# Patient Record
Sex: Female | Born: 1950
Health system: Southern US, Community
[De-identification: ages and names within clinical notes are randomized; demographics above are authoritative.]

## PROBLEM LIST (undated history)

## (undated) DIAGNOSIS — K8020 Calculus of gallbladder without cholecystitis without obstruction: Secondary | ICD-10-CM

## (undated) DIAGNOSIS — K5792 Diverticulitis of intestine, part unspecified, without perforation or abscess without bleeding: Secondary | ICD-10-CM

## (undated) DIAGNOSIS — E7800 Pure hypercholesterolemia, unspecified: Secondary | ICD-10-CM

## (undated) DIAGNOSIS — I10 Essential (primary) hypertension: Secondary | ICD-10-CM

## (undated) HISTORY — PX: TUBAL LIGATION: SHX77

## (undated) HISTORY — DX: Pure hypercholesterolemia, unspecified: E78.00

## (undated) HISTORY — PX: COLONOSCOPY: SHX174

## (undated) HISTORY — PX: BUNIONECTOMY: SHX129

---

## 2001-03-18 ENCOUNTER — Emergency Department (HOSPITAL_COMMUNITY): Admission: EM | Admit: 2001-03-18 | Discharge: 2001-03-18 | Payer: Self-pay | Admitting: Emergency Medicine

## 2006-01-25 ENCOUNTER — Ambulatory Visit (HOSPITAL_COMMUNITY): Admission: RE | Admit: 2006-01-25 | Discharge: 2006-01-25 | Payer: Self-pay | Admitting: Internal Medicine

## 2006-07-19 ENCOUNTER — Ambulatory Visit: Payer: Self-pay | Admitting: Internal Medicine

## 2008-05-22 ENCOUNTER — Ambulatory Visit (HOSPITAL_COMMUNITY): Admission: RE | Admit: 2008-05-22 | Discharge: 2008-05-22 | Payer: Self-pay | Admitting: Family Medicine

## 2010-04-16 ENCOUNTER — Encounter (HOSPITAL_COMMUNITY): Admission: RE | Admit: 2010-04-16 | Payer: Self-pay | Source: Home / Self Care | Admitting: Preventative Medicine

## 2010-05-09 ENCOUNTER — Encounter: Payer: Self-pay | Admitting: Internal Medicine

## 2012-02-06 ENCOUNTER — Emergency Department (HOSPITAL_COMMUNITY): Payer: BC Managed Care – PPO

## 2012-02-06 ENCOUNTER — Emergency Department (HOSPITAL_COMMUNITY)
Admission: EM | Admit: 2012-02-06 | Discharge: 2012-02-07 | Disposition: A | Discharge status: Home / Self Care | Payer: BC Managed Care – PPO | Attending: Emergency Medicine | Admitting: Emergency Medicine

## 2012-02-06 ENCOUNTER — Encounter (HOSPITAL_COMMUNITY): Payer: Self-pay | Admitting: Emergency Medicine

## 2012-02-06 DIAGNOSIS — I10 Essential (primary) hypertension: Secondary | ICD-10-CM | POA: Insufficient documentation

## 2012-02-06 DIAGNOSIS — K5732 Diverticulitis of large intestine without perforation or abscess without bleeding: Secondary | ICD-10-CM | POA: Insufficient documentation

## 2012-02-06 DIAGNOSIS — Z79899 Other long term (current) drug therapy: Secondary | ICD-10-CM | POA: Insufficient documentation

## 2012-02-06 HISTORY — DX: Essential (primary) hypertension: I10

## 2012-02-06 LAB — BASIC METABOLIC PANEL
CO2: 25 mEq/L (ref 19–32)
Calcium: 9.6 mg/dL (ref 8.4–10.5)
Creatinine, Ser: 0.8 mg/dL (ref 0.50–1.10)
Glucose, Bld: 102 mg/dL — ABNORMAL HIGH (ref 70–99)

## 2012-02-06 LAB — CBC WITH DIFFERENTIAL/PLATELET
Eosinophils Absolute: 0.3 10*3/uL (ref 0.0–0.7)
Eosinophils Relative: 4 % (ref 0–5)
HCT: 37.2 % (ref 36.0–46.0)
Lymphocytes Relative: 48 % — ABNORMAL HIGH (ref 12–46)
Lymphs Abs: 3.5 10*3/uL (ref 0.7–4.0)
MCH: 23 pg — ABNORMAL LOW (ref 26.0–34.0)
MCV: 71.4 fL — ABNORMAL LOW (ref 78.0–100.0)
Monocytes Absolute: 0.5 10*3/uL (ref 0.1–1.0)
RBC: 5.21 MIL/uL — ABNORMAL HIGH (ref 3.87–5.11)
RDW: 13.4 % (ref 11.5–15.5)
WBC: 7.2 10*3/uL (ref 4.0–10.5)

## 2012-02-06 LAB — URINALYSIS, ROUTINE W REFLEX MICROSCOPIC
Bilirubin Urine: NEGATIVE
Glucose, UA: NEGATIVE mg/dL
Ketones, ur: NEGATIVE mg/dL
Protein, ur: NEGATIVE mg/dL

## 2012-02-06 LAB — URINE MICROSCOPIC-ADD ON

## 2012-02-06 NOTE — ED Notes (Signed)
Patient c/o left flank that radiates into the front of abdomen with associated nausea.  Patient states has had for 2 weeks; seen by 2 MD that state she has kidney stone; has not had CT scan performed.

## 2012-02-07 ENCOUNTER — Encounter (HOSPITAL_COMMUNITY): Payer: Self-pay | Admitting: *Deleted

## 2012-02-07 ENCOUNTER — Inpatient Hospital Stay (HOSPITAL_COMMUNITY)
Admission: EM | Admit: 2012-02-07 | Discharge: 2012-02-09 | DRG: 182 | Disposition: A | Discharge status: Home / Self Care | Payer: BC Managed Care – PPO | Attending: Internal Medicine | Admitting: Internal Medicine

## 2012-02-07 DIAGNOSIS — I10 Essential (primary) hypertension: Secondary | ICD-10-CM | POA: Diagnosis present

## 2012-02-07 DIAGNOSIS — K8020 Calculus of gallbladder without cholecystitis without obstruction: Secondary | ICD-10-CM | POA: Diagnosis present

## 2012-02-07 DIAGNOSIS — R112 Nausea with vomiting, unspecified: Secondary | ICD-10-CM | POA: Diagnosis present

## 2012-02-07 DIAGNOSIS — K5732 Diverticulitis of large intestine without perforation or abscess without bleeding: Principal | ICD-10-CM

## 2012-02-07 DIAGNOSIS — E876 Hypokalemia: Secondary | ICD-10-CM | POA: Diagnosis present

## 2012-02-07 DIAGNOSIS — K5792 Diverticulitis of intestine, part unspecified, without perforation or abscess without bleeding: Secondary | ICD-10-CM | POA: Diagnosis present

## 2012-02-07 DIAGNOSIS — Z87891 Personal history of nicotine dependence: Secondary | ICD-10-CM

## 2012-02-07 DIAGNOSIS — E860 Dehydration: Secondary | ICD-10-CM | POA: Diagnosis present

## 2012-02-07 DIAGNOSIS — Z880 Allergy status to penicillin: Secondary | ICD-10-CM

## 2012-02-07 DIAGNOSIS — D649 Anemia, unspecified: Secondary | ICD-10-CM | POA: Diagnosis present

## 2012-02-07 DIAGNOSIS — Z79899 Other long term (current) drug therapy: Secondary | ICD-10-CM

## 2012-02-07 HISTORY — DX: Calculus of gallbladder without cholecystitis without obstruction: K80.20

## 2012-02-07 HISTORY — DX: Diverticulitis of intestine, part unspecified, without perforation or abscess without bleeding: K57.92

## 2012-02-07 LAB — CBC
MCV: 71 fL — ABNORMAL LOW (ref 78.0–100.0)
Platelets: 282 10*3/uL (ref 150–400)
RDW: 13.4 % (ref 11.5–15.5)
WBC: 5 10*3/uL (ref 4.0–10.5)

## 2012-02-07 LAB — CREATININE, SERUM
Creatinine, Ser: 0.73 mg/dL (ref 0.50–1.10)
GFR calc Af Amer: >90 mL/min (ref >90)
GFR calc non Af Amer: 90 mL/min — ABNORMAL LOW (ref >90)

## 2012-02-07 MED ORDER — ONDANSETRON HCL 4 MG/2ML IJ SOLN: 4.0000 mg | Freq: Three times a day (TID) | INTRAMUSCULAR | Status: DC | PRN

## 2012-02-07 MED ORDER — SODIUM CHLORIDE 0.9 % IV SOLN: INTRAVENOUS | Status: DC

## 2012-02-07 MED ORDER — POTASSIUM CHLORIDE IN NACL 20-0.45 MEQ/L-% IV SOLN
INTRAVENOUS | Status: DC
  Administered 2012-02-07 – 2012-02-08 (×2): via INTRAVENOUS
  Filled 2012-02-07 (×4): qty 1000

## 2012-02-07 MED ORDER — CIPROFLOXACIN HCL 250 MG PO TABS
500.0000 mg | ORAL_TABLET | Freq: Once | ORAL | Status: AC
  Administered 2012-02-07: 500 mg via ORAL
  Filled 2012-02-07: qty 2

## 2012-02-07 MED ORDER — HYDROMORPHONE HCL PF 1 MG/ML IJ SOLN
1.0000 mg | INTRAMUSCULAR | Status: DC | PRN
  Administered 2012-02-07 – 2012-02-08 (×2): 1 mg via INTRAVENOUS
  Filled 2012-02-07 (×2): qty 1

## 2012-02-07 MED ORDER — ONDANSETRON HCL 4 MG PO TABS: 4.0000 mg | ORAL_TABLET | Freq: Four times a day (QID) | ORAL | Status: DC | PRN

## 2012-02-07 MED ORDER — CIPROFLOXACIN IN D5W 400 MG/200ML IV SOLN
400.0000 mg | Freq: Two times a day (BID) | INTRAVENOUS | Status: DC
  Administered 2012-02-08 – 2012-02-09 (×3): 400 mg via INTRAVENOUS
  Filled 2012-02-07 (×5): qty 200

## 2012-02-07 MED ORDER — ENOXAPARIN SODIUM 40 MG/0.4ML ~~LOC~~ SOLN
40.0000 mg | SUBCUTANEOUS | Status: DC
  Administered 2012-02-07 – 2012-02-08 (×2): 40 mg via SUBCUTANEOUS
  Filled 2012-02-07 (×2): qty 0.4

## 2012-02-07 MED ORDER — CIPROFLOXACIN IN D5W 400 MG/200ML IV SOLN
400.0000 mg | Freq: Once | INTRAVENOUS | Status: AC
  Administered 2012-02-07: 400 mg via INTRAVENOUS
  Filled 2012-02-07: qty 200

## 2012-02-07 MED ORDER — HYDROCODONE-ACETAMINOPHEN 5-325 MG PO TABS: 1.0000 | ORAL_TABLET | Freq: Four times a day (QID) | ORAL | Status: DC | PRN

## 2012-02-07 MED ORDER — POTASSIUM CHLORIDE IN NACL 20-0.45 MEQ/L-% IV SOLN
INTRAVENOUS | Status: AC
  Filled 2012-02-07: qty 1000

## 2012-02-07 MED ORDER — METRONIDAZOLE 500 MG PO TABS: 500.0000 mg | ORAL_TABLET | Freq: Two times a day (BID) | ORAL | Status: DC

## 2012-02-07 MED ORDER — METRONIDAZOLE IN NACL 5-0.79 MG/ML-% IV SOLN
500.0000 mg | Freq: Once | INTRAVENOUS | Status: AC
  Administered 2012-02-07: 500 mg via INTRAVENOUS
  Filled 2012-02-07: qty 100

## 2012-02-07 MED ORDER — ACETAMINOPHEN 325 MG PO TABS: 650.0000 mg | ORAL_TABLET | Freq: Four times a day (QID) | ORAL | Status: DC | PRN

## 2012-02-07 MED ORDER — HYDROMORPHONE HCL PF 1 MG/ML IJ SOLN
1.0000 mg | INTRAMUSCULAR | Status: DC | PRN
  Administered 2012-02-07: 1 mg via INTRAVENOUS
  Filled 2012-02-07: qty 1

## 2012-02-07 MED ORDER — POTASSIUM CHLORIDE CRYS ER 20 MEQ PO TBCR
40.0000 meq | EXTENDED_RELEASE_TABLET | Freq: Once | ORAL | Status: AC
  Administered 2012-02-07: 40 meq via ORAL
  Filled 2012-02-07: qty 2

## 2012-02-07 MED ORDER — ONDANSETRON HCL 4 MG/2ML IJ SOLN
4.0000 mg | Freq: Four times a day (QID) | INTRAMUSCULAR | Status: DC | PRN
  Administered 2012-02-08 – 2012-02-09 (×3): 4 mg via INTRAVENOUS
  Filled 2012-02-07 (×3): qty 2

## 2012-02-07 MED ORDER — HYDROCODONE-ACETAMINOPHEN 5-325 MG PO TABS
1.0000 | ORAL_TABLET | Freq: Four times a day (QID) | ORAL | Status: DC | PRN
  Administered 2012-02-07 – 2012-02-08 (×2): 1 via ORAL
  Filled 2012-02-07 (×2): qty 1

## 2012-02-07 MED ORDER — METRONIDAZOLE 500 MG PO TABS
500.0000 mg | ORAL_TABLET | Freq: Once | ORAL | Status: AC
  Administered 2012-02-07: 500 mg via ORAL
  Filled 2012-02-07: qty 1

## 2012-02-07 MED ORDER — ACETAMINOPHEN 650 MG RE SUPP: 650.0000 mg | Freq: Four times a day (QID) | RECTAL | Status: DC | PRN

## 2012-02-07 MED ORDER — METRONIDAZOLE IN NACL 5-0.79 MG/ML-% IV SOLN
500.0000 mg | Freq: Three times a day (TID) | INTRAVENOUS | Status: DC
  Administered 2012-02-08 – 2012-02-09 (×5): 500 mg via INTRAVENOUS
  Filled 2012-02-07 (×8): qty 100

## 2012-02-07 MED ORDER — SODIUM CHLORIDE 0.9 % IV SOLN
INTRAVENOUS | Status: DC
  Administered 2012-02-07: 125 mL/h via INTRAVENOUS

## 2012-02-07 MED ORDER — LISINOPRIL 5 MG PO TABS
5.0000 mg | ORAL_TABLET | Freq: Every day | ORAL | Status: DC
  Administered 2012-02-07 – 2012-02-09 (×3): 5 mg via ORAL
  Filled 2012-02-07 (×3): qty 1

## 2012-02-07 MED ORDER — CIPROFLOXACIN HCL 500 MG PO TABS: 500.0000 mg | ORAL_TABLET | Freq: Two times a day (BID) | ORAL | Status: DC

## 2012-02-07 MED ORDER — TRAMADOL HCL 50 MG PO TABS
50.0000 mg | ORAL_TABLET | Freq: Four times a day (QID) | ORAL | Status: DC
  Administered 2012-02-07 – 2012-02-09 (×6): 50 mg via ORAL
  Filled 2012-02-07 (×6): qty 1

## 2012-02-07 MED ORDER — CYCLOBENZAPRINE HCL 10 MG PO TABS: 10.0000 mg | ORAL_TABLET | Freq: Three times a day (TID) | ORAL | Status: DC | PRN

## 2012-02-07 NOTE — ED Provider Notes (Addendum)
History     CSN: [id number]  Arrival date & time 02/06/12  2156   First MD Initiated Contact with Patient 02/06/12 2306      Chief Complaint  Patient presents with  . Flank Pain    (Consider location/radiation/quality/duration/timing/severity/associated sxs/prior treatment) HPI Comments: States she has been having L flank pain ~ 2 weeks.  Has been seen by 2 MD's that have told her she has a kidney stone.  Has not had any imaging performed.  No fever or chills.  The history is provided by the patient. No language interpreter was used.    Past Medical History  Diagnosis Date  . Hypertension     Past Surgical History  Procedure Date  . Tubal ligation     No family history on file.  History  Substance Use Topics  . Smoking status: Former Games developer  . Smokeless tobacco: Not on file  . Alcohol Use: Yes    OB History    Grav Para Term Preterm Abortions TAB SAB Ect Mult Living                  Review of Systems  Constitutional: Negative for fever and chills.  Gastrointestinal: Positive for abdominal pain. Negative for nausea, vomiting, diarrhea, constipation, blood in stool, abdominal distention and anal bleeding.  Genitourinary: Negative for dysuria, urgency, frequency, hematuria, vaginal bleeding, vaginal discharge, difficulty urinating and vaginal pain.  All other systems reviewed and are negative.    Allergies  Penicillins  Home Medications   Current Outpatient Rx  Name Route Sig Dispense Refill  . CYCLOBENZAPRINE HCL 10 MG PO TABS Oral Take 10 mg by mouth 3 (three) times daily as needed. Muscle spasm    . LISINOPRIL 5 MG PO TABS Oral Take 5 mg by mouth daily.    Marland Kitchen NITROFURANTOIN MACROCRYSTAL 100 MG PO CAPS Oral Take 100 mg by mouth 2 (two) times daily. For 7 days    . TRAMADOL HCL 50 MG PO TABS Oral Take 50 mg by mouth 4 (four) times daily.    Marland Kitchen CIPROFLOXACIN HCL 500 MG PO TABS Oral Take 1 tablet (500 mg total) by mouth 2 (two) times daily. 14 tablet 0  .  METRONIDAZOLE 500 MG PO TABS Oral Take 1 tablet (500 mg total) by mouth 2 (two) times daily. 14 tablet 0    BP 153/94  Pulse 104  Temp 98.1 F (36.7 C) (Oral)  Resp 20  Ht 5\' 1"  (1.549 m)  Wt 170 lb (77.111 kg)  BMI 32.12 kg/m2  SpO2 100%  Physical Exam  Nursing note and vitals reviewed. Constitutional: She is oriented to person, place, and time. She appears well-developed and well-nourished. No distress.  HENT:  Head: Normocephalic and atraumatic.  Eyes: EOM are normal.  Neck: Normal range of motion.  Cardiovascular: Normal rate and regular rhythm.   Pulmonary/Chest: Effort normal.  Abdominal: Soft. She exhibits no distension and no mass. There is no hepatosplenomegaly. There is tenderness in the left upper quadrant and left lower quadrant. There is no rebound, no guarding and no CVA tenderness.    Musculoskeletal: Normal range of motion.  Neurological: She is alert and oriented to person, place, and time.  Skin: Skin is warm and dry.  Psychiatric: She has a normal mood and affect. Judgment normal.    ED Course  Procedures (including critical care time)  Labs Reviewed  CBC WITH DIFFERENTIAL - Abnormal; Notable for the following:    RBC 5.21 (*)  MCV 71.4 (*)     MCH 23.0 (*)     Neutrophils Relative 41 (*)     Lymphocytes Relative 48 (*)     All other components within normal limits  BASIC METABOLIC PANEL - Abnormal; Notable for the following:    Potassium 3.2 (*)     Glucose, Bld 102 (*)     GFR calc non Af Amer 77 (*)     GFR calc Af Amer 90 (*)     All other components within normal limits  URINALYSIS, ROUTINE W REFLEX MICROSCOPIC - Abnormal; Notable for the following:    APPearance HAZY (*)     Specific Gravity, Urine >1.030 (*)     Hgb urine dipstick SMALL (*)     Leukocytes, UA SMALL (*)     All other components within normal limits  URINE MICROSCOPIC-ADD ON - Abnormal; Notable for the following:    Squamous Epithelial / LPF FEW (*)     All other  components within normal limits  URINE CULTURE   Ct Abdomen Pelvis Wo Contrast  02/07/2012  *RADIOLOGY REPORT*  Clinical Data: Left flank pain  CT ABDOMEN AND PELVIS WITHOUT CONTRAST  Technique:  Multidetector CT imaging of the abdomen and pelvis was performed following the standard protocol without intravenous contrast.  Comparison: None.  Findings: Limited images through the lung bases demonstrate no significant appreciable abnormality. The heart size is within normal limits. No pleural or pericardial effusion.  Organ abnormality/lesion detection is limited in the absence of intravenous contrast. Within this limitation, unremarkable liver, spleen, pancreas, adrenal glands.  Gallstones along the gallbladder fundus.  No biliary ductal dilatation.  Symmetric renal size.  No hydronephrosis or hydroureter.  No ureteral calculi.  No bowel obstruction.  Sigmoid colon diverticulosis.  Pericolonic fat stranding about the sigmoid colon/descending colon junction, in keeping with acute diverticulitis.  No free intraperitoneal air or fluid.  No lymphadenopathy.  Decompressed bladder.  Unremarkable CT appearance to the uterus and adnexa.  There is scattered atherosclerotic calcification of the aorta and its branches. No aneurysmal dilatation.  L5 S1 degenerative disc disease.  No acute osseous finding.  IMPRESSION: Acute diverticulitis at the sigmoid colon/descending colon junction.  Age appropriate colonoscopy screening recommended if not yet performed to exclude an underlying lesion.  No hydronephrosis or urinary tract calculi.  Gallstones.  No CT evidence for cholecystitis.   Original Report Authenticated By: Waneta Martins, M.D.      1. Diverticulitis of sigmoid colon       MDM  rx-cipro 500 mg  BID, 14 rx-flagyl 500 mg BID, 14        Evalina Field, [place] 02/07/12 0055  Evalina Field, PA 03/02/12 1701

## 2012-02-07 NOTE — ED Provider Notes (Signed)
History    This chart was scribed for Geoffery Lyons, MD, MD by Smitty Pluck. The patient was seen in room APA18 and the patient's care was started at 3:35PM.   CSN: [id number]  Arrival date & time 02/07/12  1519      Chief Complaint  Patient presents with  . Emesis    (Consider location/radiation/quality/duration/timing/severity/associated sxs/prior treatment) Patient is a 61 y.o. female presenting with vomiting. The history is provided by the patient. No language interpreter was used.  Emesis  Associated symptoms include abdominal pain.   Selena Christian is a 61 y.o. female who presents to the Emergency Department complaining of constant, moderate emesis onset 1 day ago. Pt reports that she was in ED 1 day ago and diagnosed with diverticulitis. She reports that she is unable to take prescribed medication due to nausea. She reports food intake aggravates abdominal pain. She reports that she still has abdominal pain. Denies fever, chills, vomiting blood and any other pain.   PCP Dr. Loreta Ave  Past Medical History  Diagnosis Date  . Hypertension   . Diverticulitis     Past Surgical History  Procedure Date  . Tubal ligation     History reviewed. No pertinent family history.  History  Substance Use Topics  . Smoking status: Former Games developer  . Smokeless tobacco: Not on file  . Alcohol Use: Yes    OB History    Grav Para Term Preterm Abortions TAB SAB Ect Mult Living                  Review of Systems  Gastrointestinal: Positive for nausea, vomiting and abdominal pain.  All other systems reviewed and are negative.    Allergies  Penicillins  Home Medications   Current Outpatient Rx  Name Route Sig Dispense Refill  . CIPROFLOXACIN HCL 500 MG PO TABS Oral Take 1 tablet (500 mg total) by mouth 2 (two) times daily. 14 tablet 0  . CYCLOBENZAPRINE HCL 10 MG PO TABS Oral Take 10 mg by mouth 3 (three) times daily as needed. Muscle spasm    . HYDROCODONE-ACETAMINOPHEN 5-325  MG PO TABS Oral Take 1 tablet by mouth every 6 (six) hours as needed for pain. 20 tablet 0  . LISINOPRIL 5 MG PO TABS Oral Take 5 mg by mouth daily.    Marland Kitchen METRONIDAZOLE 500 MG PO TABS Oral Take 1 tablet (500 mg total) by mouth 2 (two) times daily. 14 tablet 0  . NITROFURANTOIN MACROCRYSTAL 100 MG PO CAPS Oral Take 100 mg by mouth 2 (two) times daily. For 7 days    . TRAMADOL HCL 50 MG PO TABS Oral Take 50 mg by mouth 4 (four) times daily.      BP 137/91  Pulse 95  Temp 98.3 F (36.8 C) (Oral)  Resp 20  Ht 5\' 1"  (1.549 m)  Wt 170 lb (77.111 kg)  BMI 32.12 kg/m2  SpO2 100%  Physical Exam  Nursing note and vitals reviewed. Constitutional: She is oriented to person, place, and time. She appears well-developed and well-nourished. No distress.  HENT:  Head: Normocephalic and atraumatic.  Eyes: EOM are normal. Pupils are equal, round, and reactive to light.  Neck: Normal range of motion. Neck supple. No tracheal deviation present.  Cardiovascular: Normal rate.   Pulmonary/Chest: Effort normal. No respiratory distress.  Abdominal: Soft. She exhibits no distension. There is tenderness in the left upper quadrant and left lower quadrant. There is no rebound and no guarding.  Musculoskeletal:  Normal range of motion.  Neurological: She is alert and oriented to person, place, and time.  Skin: Skin is warm and dry.  Psychiatric: She has a normal mood and affect. Her behavior is normal.    ED Course  Procedures (including critical care time) DIAGNOSTIC STUDIES: Oxygen Saturation is 100% on room air, normal by my interpretation.    COORDINATION OF CARE: 3:42 PM Discussed ED treatment with pt     Labs Reviewed - No data to display Ct Abdomen Pelvis Wo Contrast  02/07/2012  *RADIOLOGY REPORT*  Clinical Data: Left flank pain  CT ABDOMEN AND PELVIS WITHOUT CONTRAST  Technique:  Multidetector CT imaging of the abdomen and pelvis was performed following the standard protocol without intravenous  contrast.  Comparison: None.  Findings: Limited images through the lung bases demonstrate no significant appreciable abnormality. The heart size is within normal limits. No pleural or pericardial effusion.  Organ abnormality/lesion detection is limited in the absence of intravenous contrast. Within this limitation, unremarkable liver, spleen, pancreas, adrenal glands.  Gallstones along the gallbladder fundus.  No biliary ductal dilatation.  Symmetric renal size.  No hydronephrosis or hydroureter.  No ureteral calculi.  No bowel obstruction.  Sigmoid colon diverticulosis.  Pericolonic fat stranding about the sigmoid colon/descending colon junction, in keeping with acute diverticulitis.  No free intraperitoneal air or fluid.  No lymphadenopathy.  Decompressed bladder.  Unremarkable CT appearance to the uterus and adnexa.  There is scattered atherosclerotic calcification of the aorta and its branches. No aneurysmal dilatation.  L5 S1 degenerative disc disease.  No acute osseous finding.  IMPRESSION: Acute diverticulitis at the sigmoid colon/descending colon junction.  Age appropriate colonoscopy screening recommended if not yet performed to exclude an underlying lesion.  No hydronephrosis or urinary tract calculi.  Gallstones.  No CT evidence for cholecystitis.   Original Report Authenticated By: Waneta Martins, M.D.      No diagnosis found.    MDM  The patient with n/v/d since last night.  She was diagnosed with diverticulitis yesterday and discharged with antibiotics she has been unable to keep down.  She will be admitted to medicine for iv antibiotics, hydration.      I personally performed the services described in this documentation, which was scribed in my presence. The recorded information has been reviewed and considered.      Geoffery Lyons, MD 02/07/12 [phone]

## 2012-02-07 NOTE — ED Notes (Signed)
Patient began vomiting after taking dose of Flagyl and Cipro this AM.  Has vomited x 3, yellow fluid.  Epigastric and L lateral abdomen. Stool x 1 today, liquid.  No hematemesis or blood noted in stools.

## 2012-02-07 NOTE — ED Provider Notes (Signed)
Medical screening examination/treatment/procedure(s) were performed by non-physician practitioner and as supervising physician I was immediately available for consultation/collaboration.  Josip Merolla S. Amethyst Gainer, MD 02/07/12 0528 

## 2012-02-07 NOTE — ED Notes (Signed)
Report called to Maryanne on unit 300. 

## 2012-02-07 NOTE — H&P (Signed)
[facility] History and Physical  Selena Christian ZOX:[id number] DOB: April 15, 1951 DOA: 02/07/2012  Referring physician: Dr. Judd Lien PCP: Lenise Herald, PA  Specialists:   Chief Complaint: abd pain  HPI: Selena Christian is a 61 y.o. female that presents to the emergency room with abdominal pain, nausea, vomiting and diarrhea. The patient reports onset of her symptoms approximately 2 weeks ago which started to have left lower quadrant abdominal pain. The symptoms persisted and progressively got worse. She did not have any chills or fever. She presented to the emergency room where CT scan revealed a sigmoid diverticulitis. She was given oral ciprofloxacin and Flagyl and was discharged home. Unfortunately after she reached home, she developed nausea and vomiting and was unable to keep down any of her medications. Her by mouth intake has been poor. She also reports some significant diarrhea, but denies any melena or hematochezia. Since she has failed outpatient therapy for diverticulitis, she's been referred for inpatient therapy  Review of Systems: Pertinent positives per history of present illness, otherwise negative  Past Medical History  Diagnosis Date  . Hypertension   . Diverticulitis    Past Surgical History  Procedure Date  . Tubal ligation    Social History:  reports that she has quit smoking. She does not have any smokeless tobacco history on file. She reports that she drinks alcohol. She reports that she does not use illicit drugs.   Allergies  Allergen Reactions  . Penicillins Other (See Comments)    Child hood allergy    Family history: Mother is on dialysis, father has diabetes, no bowel disease in the family   Prior to Admission medications   Medication Sig Start Date End Date Taking? Authorizing Provider  ciprofloxacin (CIPRO) 500 MG tablet Take 1 tablet (500 mg total) by mouth 2 (two) times daily. 02/07/12  Yes Richard Hyacinth Meeker, PA  cyclobenzaprine (FLEXERIL) 10 MG  tablet Take 10 mg by mouth 3 (three) times daily as needed. Muscle spasm   Yes Historical Provider, MD  HYDROcodone-acetaminophen (NORCO/VICODIN) 5-325 MG per tablet Take 1 tablet by mouth every 6 (six) hours as needed for pain. 02/07/12 02/17/12 Yes Richard Hyacinth Meeker, PA  lisinopril (PRINIVIL,ZESTRIL) 5 MG tablet Take 5 mg by mouth daily.   Yes Historical Provider, MD  metroNIDAZOLE (FLAGYL) 500 MG tablet Take 1 tablet (500 mg total) by mouth 2 (two) times daily. 02/07/12  Yes Evalina Field, PA  traMADol (ULTRAM) 50 MG tablet Take 50 mg by mouth 4 (four) times daily.   Yes Historical Provider, MD   Physical Exam: Filed Vitals:   02/07/12 1522 02/07/12 1808 02/07/12 1823  BP: 137/91 141/81 161/88  Pulse: 95 65 66  Temp: 98.3 F (36.8 C) 98.3 F (36.8 C) 97.9 F (36.6 C)  TempSrc: Oral Oral Oral  Resp: 20  20  Height: 5\' 1"  (1.549 m)  5\' 1"  (1.549 m)  Weight: 77.111 kg (170 lb)  77.1 kg (169 lb 15.6 oz)  SpO2: 100% 98% 100%     General:  NAD  Eyes: Pupils are equal round react to light and accommodation  ENT: Mucous membranes are dry  Neck: Supple  Cardiovascular: S1, S2 regular rate and rhythm  Respiratory: Clear to auscultation bilaterally  Abdomen: Soft, tender in the left lower corner, bowel sounds are active  Skin: Normal  Musculoskeletal: Deferred  Psychiatric: Normal affect, cooperative with exam  Neurologic: Grossly intact, nonfocal  Labs on Admission:  Basic Metabolic Panel:  Lab 02/06/12 8119  NA 137  K 3.2*  CL 101  CO2 25  GLUCOSE 102*  BUN 18  CREATININE 0.80  CALCIUM 9.6  MG --  PHOS --   Liver Function Tests: No results found for this basename: AST:5,ALT:5,ALKPHOS:5,BILITOT:5,PROT:5,ALBUMIN:5 in the last 168 hours No results found for this basename: LIPASE:5,AMYLASE:5 in the last 168 hours No results found for this basename: AMMONIA:5 in the last 168 hours CBC:  Lab 02/06/12 2312  WBC 7.2  NEUTROABS 2.9  HGB 12.0  HCT 37.2  MCV 71.4*    PLT 295   Cardiac Enzymes: No results found for this basename: CKTOTAL:5,CKMB:5,CKMBINDEX:5,TROPONINI:5 in the last 168 hours  BNP (last 3 results) No results found for this basename: PROBNP:3 in the last 8760 hours CBG: No results found for this basename: GLUCAP:5 in the last 168 hours  Radiological Exams on Admission: Ct Abdomen Pelvis Wo Contrast  02/07/2012  *RADIOLOGY REPORT*  Clinical Data: Left flank pain  CT ABDOMEN AND PELVIS WITHOUT CONTRAST  Technique:  Multidetector CT imaging of the abdomen and pelvis was performed following the standard protocol without intravenous contrast.  Comparison: None.  Findings: Limited images through the lung bases demonstrate no significant appreciable abnormality. The heart size is within normal limits. No pleural or pericardial effusion.  Organ abnormality/lesion detection is limited in the absence of intravenous contrast. Within this limitation, unremarkable liver, spleen, pancreas, adrenal glands.  Gallstones along the gallbladder fundus.  No biliary ductal dilatation.  Symmetric renal size.  No hydronephrosis or hydroureter.  No ureteral calculi.  No bowel obstruction.  Sigmoid colon diverticulosis.  Pericolonic fat stranding about the sigmoid colon/descending colon junction, in keeping with acute diverticulitis.  No free intraperitoneal air or fluid.  No lymphadenopathy.  Decompressed bladder.  Unremarkable CT appearance to the uterus and adnexa.  There is scattered atherosclerotic calcification of the aorta and its branches. No aneurysmal dilatation.  L5 S1 degenerative disc disease.  No acute osseous finding.  IMPRESSION: Acute diverticulitis at the sigmoid colon/descending colon junction.  Age appropriate colonoscopy screening recommended if not yet performed to exclude an underlying lesion.  No hydronephrosis or urinary tract calculi.  Gallstones.  No CT evidence for cholecystitis.   Original Report Authenticated By: Waneta Martins, M.D.       Assessment/Plan Principal Problem:  *Diverticulitis Active Problems:  Dehydration  Hypokalemia  Nausea & vomiting  Hypertension   1. Acute diverticulitis. Since patient has failed by mouth therapy, she'll be given IV ciprofloxacin and Flagyl. We will start her on clear liquids and this can be advanced to a low-residue diet as tolerated. She'll receive supportive therapy with analgesics and antiemetics. She will need a screening colonoscopy as an outpatient. She reports her last colonoscopy being approximately 10 years ago. She is already been set up with Dr. fields to be seen as an outpatient. 2. Dehydration. Continue IV fluids 3. Hypokalemia. Replace 4. Hypertension. Continue outpatient regimen  Code Status: Full code  Family Communication: Discussed with patient and multiple family members at the bedside  Disposition Plan: Discharge home once able to tolerate by mouth   Time spent: 55 minutes  Joquan Lotz [facility] Pager [phone]  If 7PM-7AM, please contact night-coverage [email or web address] Password [email or web address] 02/07/2012, 7:09 PM

## 2012-02-07 NOTE — ED Notes (Signed)
Discharge instructions given and reviewed with patient.  Prescriptions given for Hydrocodone, Cipro and Flagyl; effects and use explained.  Patient verbalized understanding to complete antibiotic and sedating effects of Hydrocodone.  Patient ambulatory; discharged home in good condition.

## 2012-02-07 NOTE — ED Notes (Addendum)
Seen here yesterday for diverticulitis,  Now vomiting, thinks due to new meds, cipro and flagyl.  abd pain

## 2012-02-08 LAB — COMPREHENSIVE METABOLIC PANEL
AST: 13 U/L (ref 0–37)
CO2: 24 mEq/L (ref 19–32)
Calcium: 9.1 mg/dL (ref 8.4–10.5)
Creatinine, Ser: 0.81 mg/dL (ref 0.50–1.10)
GFR calc Af Amer: 88 mL/min — ABNORMAL LOW (ref >90)
GFR calc non Af Amer: 76 mL/min — ABNORMAL LOW (ref >90)
Total Protein: 7 g/dL (ref 6.0–8.3)

## 2012-02-08 LAB — CBC
Hemoglobin: 11.1 g/dL — ABNORMAL LOW (ref 12.0–15.0)
MCH: 22.7 pg — ABNORMAL LOW (ref 26.0–34.0)
RBC: 4.9 MIL/uL (ref 3.87–5.11)

## 2012-02-08 MED ORDER — CIPROFLOXACIN IN D5W 400 MG/200ML IV SOLN
INTRAVENOUS | Status: AC
  Filled 2012-02-08: qty 200

## 2012-02-08 MED ORDER — METRONIDAZOLE IN NACL 5-0.79 MG/ML-% IV SOLN
INTRAVENOUS | Status: AC
  Filled 2012-02-08: qty 100

## 2012-02-08 NOTE — Progress Notes (Signed)
[facility]             Progress Note   Subjective: Feeling better today.  No diarrhea.  No vomiting.  She is still nauseous.  Abd pain is improving.  Objective: Vital signs in last 24 hours: Temp:  [97.9 F (36.6 C)-98.3 F (36.8 C)] 98 F (36.7 C) (10/22 1454) Pulse Rate:  [65-68] 66  (10/22 1454) Resp:  [16-20] 16  (10/22 1454) BP: (120-161)/(80-90) 145/90 mmHg (10/22 1454) SpO2:  [96 %-100 %] 96 % (10/22 1454) Weight:  [77.1 kg (169 lb 15.6 oz)] 77.1 kg (169 lb 15.6 oz) (10/21 1823) Weight change:  Last BM Date: 02/07/12  Intake/Output from previous day: 10/21 0701 - 10/22 0700 In: 240 [P.O.:240] Out: -  Total I/O In: 1300 [P.O.:600; I.V.:700] Out: -    Physical Exam: General: Alert, awake, oriented x3, in no acute distress. HEENT: No bruits, no goiter. Heart: Regular rate and rhythm, without murmurs, rubs, gallops. Lungs: Clear to auscultation bilaterally. Abdomen: Soft, nontender, nondistended, positive bowel sounds. Extremities: No clubbing cyanosis or edema with positive pedal pulses. Neuro: Grossly intact, nonfocal.    Lab Results: Basic Metabolic Panel:  Basename 02/08/12 0526 02/07/12 1939 02/06/12 2312  NA 136 -- 137  K 4.1 -- 3.2*  CL 104 -- 101  CO2 24 -- 25  GLUCOSE 133* -- 102*  BUN 11 -- 18  CREATININE 0.81 0.73 --  CALCIUM 9.1 -- 9.6  MG -- -- --  PHOS -- -- --   Liver Function Tests:  Basename 02/08/12 0526  AST 13  ALT 13  ALKPHOS 67  BILITOT 0.3  PROT 7.0  ALBUMIN 3.4*   No results found for this basename: LIPASE:2,AMYLASE:2 in the last 72 hours No results found for this basename: AMMONIA:2 in the last 72 hours CBC:  Basename 02/08/12 0526 02/07/12 1939 02/06/12 2312  WBC 5.2 5.0 --  NEUTROABS -- -- 2.9  HGB 11.1* 11.6* --  HCT 35.2* 35.8* --  MCV 71.8* 71.0* --  PLT 303 282 --   Cardiac Enzymes: No results found for this basename: CKTOTAL:3,CKMB:3,CKMBINDEX:3,TROPONINI:3 in the last 72 hours BNP: No  results found for this basename: PROBNP:3 in the last 72 hours D-Dimer: No results found for this basename: DDIMER:2 in the last 72 hours CBG: No results found for this basename: GLUCAP:6 in the last 72 hours Hemoglobin A1C: No results found for this basename: HGBA1C in the last 72 hours Fasting Lipid Panel: No results found for this basename: CHOL,HDL,LDLCALC,TRIG,CHOLHDL,LDLDIRECT in the last 72 hours Thyroid Function Tests: No results found for this basename: TSH,T4TOTAL,FREET4,T3FREE,THYROIDAB in the last 72 hours Anemia Panel: No results found for this basename: VITAMINB12,FOLATE,FERRITIN,TIBC,IRON,RETICCTPCT in the last 72 hours Coagulation: No results found for this basename: LABPROT:2,INR:2 in the last 72 hours Urine Drug Screen: Drugs of Abuse  No results found for this basename: labopia, cocainscrnur, labbenz, amphetmu, thcu, labbarb    Alcohol Level: No results found for this basename: ETH:2 in the last 72 hours Urinalysis:  Basename 02/06/12 2255  COLORURINE YELLOW  LABSPEC >1.030*  PHURINE 5.5  GLUCOSEU NEGATIVE  HGBUR SMALL*  BILIRUBINUR NEGATIVE  KETONESUR NEGATIVE  PROTEINUR NEGATIVE  UROBILINOGEN 0.2  NITRITE NEGATIVE  LEUKOCYTESUR SMALL*    No results found for this or any previous visit (from the past 240 hour(s)).  Studies/Results: Ct Abdomen Pelvis Wo Contrast  02/07/2012  *RADIOLOGY REPORT*  Clinical Data: Left flank pain  CT ABDOMEN AND PELVIS WITHOUT CONTRAST  Technique:  Multidetector CT imaging of  the abdomen and pelvis was performed following the standard protocol without intravenous contrast.  Comparison: None.  Findings: Limited images through the lung bases demonstrate no significant appreciable abnormality. The heart size is within normal limits. No pleural or pericardial effusion.  Organ abnormality/lesion detection is limited in the absence of intravenous contrast. Within this limitation, unremarkable liver, spleen, pancreas, adrenal glands.   Gallstones along the gallbladder fundus.  No biliary ductal dilatation.  Symmetric renal size.  No hydronephrosis or hydroureter.  No ureteral calculi.  No bowel obstruction.  Sigmoid colon diverticulosis.  Pericolonic fat stranding about the sigmoid colon/descending colon junction, in keeping with acute diverticulitis.  No free intraperitoneal air or fluid.  No lymphadenopathy.  Decompressed bladder.  Unremarkable CT appearance to the uterus and adnexa.  There is scattered atherosclerotic calcification of the aorta and its branches. No aneurysmal dilatation.  L5 S1 degenerative disc disease.  No acute osseous finding.  IMPRESSION: Acute diverticulitis at the sigmoid colon/descending colon junction.  Age appropriate colonoscopy screening recommended if not yet performed to exclude an underlying lesion.  No hydronephrosis or urinary tract calculi.  Gallstones.  No CT evidence for cholecystitis.   Original Report Authenticated By: Waneta Martins, M.D.     Medications: Scheduled Meds:   . ciprofloxacin  400 mg Intravenous Once  . ciprofloxacin  400 mg Intravenous Q12H  . enoxaparin (LOVENOX) injection  40 mg Subcutaneous Q24H  . lisinopril  5 mg Oral Daily  . metronidazole  500 mg Intravenous Once  . metronidazole  500 mg Intravenous Q8H  . potassium chloride  40 mEq Oral Once  . traMADol  50 mg Oral Q6H  . DISCONTD: sodium chloride   Intravenous STAT   Continuous Infusions:   . 0.45 % NaCl with KCl 20 mEq / L 75 mL/hr at 02/08/12 1421  . DISCONTD: sodium chloride 125 mL/hr (02/07/12 1604)   PRN Meds:.acetaminophen, acetaminophen, cyclobenzaprine, HYDROcodone-acetaminophen, HYDROmorphone (DILAUDID) injection, ondansetron (ZOFRAN) IV, ondansetron, DISCONTD:  HYDROmorphone (DILAUDID) injection, DISCONTD: ondansetron (ZOFRAN) IV  Assessment/Plan:  Principal Problem:  *Diverticulitis Active Problems:  Dehydration  Hypokalemia  Nausea & vomiting  Hypertension  1. Diverticulitis.   Clinically improving. Continue IV abx.  Will try and advance diet today.  If patient is able to tolerate dinner without vomiting, I think we can change her to oral abx and plan on discharge home in the morning. If she starts vomiting, then we will have to go back to liquids. Patient has an appointment with Dr. Darrick Penna on Thursday.  I have advised her to keep this appointment. She can be scheduled for outpatient colonoscopy once her acute inflammation has improved.  2. Dehydration, improving  3. Hypokalemia, improved  Time spent coordinating care:   LOS: 1 day   Lorenia Hoston [facility] Pager: [phone] 02/08/2012, 3:44 PM

## 2012-02-09 ENCOUNTER — Encounter (HOSPITAL_COMMUNITY): Payer: Self-pay | Admitting: Internal Medicine

## 2012-02-09 DIAGNOSIS — K8020 Calculus of gallbladder without cholecystitis without obstruction: Secondary | ICD-10-CM

## 2012-02-09 HISTORY — DX: Calculus of gallbladder without cholecystitis without obstruction: K80.20

## 2012-02-09 LAB — URINE CULTURE: Colony Count: 40000

## 2012-02-09 MED ORDER — ONDANSETRON HCL 4 MG PO TABS: 4.0000 mg | ORAL_TABLET | Freq: Three times a day (TID) | ORAL | Status: DC | PRN

## 2012-02-09 MED ORDER — METRONIDAZOLE 500 MG PO TABS: 500.0000 mg | ORAL_TABLET | Freq: Three times a day (TID) | ORAL | Status: DC

## 2012-02-09 NOTE — Progress Notes (Signed)
Patient received discharge instructions along with follow up appointments and prescriptions. Patient verbalized understanding of all instructions. Patient was escorted by staff via wheelchair to vehicle. Patient discharged to home in stable condition. 

## 2012-02-09 NOTE — Discharge Summary (Signed)
Physician Discharge Summary  Selena Christian HQI:[id number] DOB: 1951/04/18 DOA: 02/07/2012  PCP: Lenise Herald, PA  Admit date: 02/07/2012 Discharge date: 02/09/2012  Time spent: greater than 30  minutes  Recommendations for Outpatient Follow-up:  1. She will followup tomorrow as scheduled with gastroenterology (NP Mrs. Sams) and her primary care provider as scheduled next week.   Discharge Diagnoses: 1. Acute diverticulitis involving the sigmoid colon/descending colon junction. 2. Nausea and vomiting, secondary to #1. 3. Dehydration. 4. Hypokalemia, supplemented and repleted. 5. Gallstones but no CT evidence for cholecystitis. 6. Mild dilutional anemia. Further outpatient evaluation will be deferred to her primary care physician.  Discharge Condition: Improved and stable.   Diet recommendation: low fat.   Filed Weights   02/07/12 1522 02/07/12 1823 02/09/12 0519  Weight: 77.111 kg (170 lb) 77.1 kg (169 lb 15.6 oz) 81.8 kg (180 lb 5.4 oz)    History of present illness:  The patient is a 61 year old woman with a past medical history significant for hypertension, who presented to the emergency department on 02/07/2012 with a chief complaint of abdominal pain, mostly left lower quadrant abdominal pain. Apparently, she was seen the day before in the emergency department and was diagnosed with sigmoid diverticulitis. She was discharged on oral Cipro and Flagyl from the emergency department. However, because of the development of nausea and vomiting, she could not keep her medications down. In the emergency department, she was afebrile and hemodynamically stable. Her lab data were significant for a potassium of 3.2 and normal WBC. She was admitted for further evaluation and management.  Hospital Course:  She was started on IV Cipro and metronidazole. IV fluid hydration was also initiated. A clear liquid diet was ordered without advancement until her symptoms subsided and/or resolve.  Antiemetic therapy was given with Zofran and IV hydromorphone was given for pain as needed. Her potassium was repleted in the IV fluids and orally. She was maintained on her outpatient antihypertensive medication regimen. Her nausea and vomiting eventually resolved. Her abdominal pain subsided. Her diet was advanced. Following the advancement in her diet, she had no worsening abdominal pain or return of nausea and vomiting. Her serum potassium improved. Her hydration status improved. At the time of discharge, Cipro and Flagyl were changed to by mouth. She was advised to continue taking Flagyl every 8 hours and Cipro twice daily for 7 more days (the patient received 2 full days of treatment during the hospitalization and a half a day of treatment prior to this hospitalization)..She was given a prescription for Zofran to take if Flagyl caused more nausea. She was instructed on a soft low fat diet for the next few days. She will followup with with Dr. Darrick Penna or her nurse practitioner Mrs. Sam's tomorrow as scheduled.  Procedures:  None  Consultations:  None   Discharge Exam: Filed Vitals:   02/08/12 0545 02/08/12 1454 02/08/12 2045 02/09/12 0519  BP: 120/80 145/90 153/82 126/78  Pulse: 68 66 69 68  Temp: 97.9 F (36.6 C) 98 F (36.7 C) 98.2 F (36.8 C)   TempSrc: Oral  Oral Oral  Resp: 19 16 18 18   Height:      Weight:    81.8 kg (180 lb 5.4 oz)  SpO2: 97% 96% 96% 95%    General: no acute distress.  Cardiovascular: S1, S2, with no murmurs rubs or gallops.  Respiratory: clear to auscultation bilaterally. Abdomen: Positive bowel sounds, mildly obese, mildly tender in the left lower quadrant. No distention, no guarding, no  masses palpated.   Discharge Instructions  Discharge Orders    Future Appointments: Provider: Department: Dept Phone: Center:   02/10/2012 9:00 AM Nira Retort, NP [facility] [phone] [facility]     Future Orders Please Complete By Expires   Diet general        Increase activity slowly      Discharge instructions      Comments:   Eat a soft low fat diet for the next 5-7 days.       Medication List     As of 02/09/2012  5:53 PM    STOP taking these medications         HYDROcodone-acetaminophen 5-325 MG per tablet   Commonly known as: NORCO/VICODIN      TAKE these medications         ciprofloxacin 500 MG tablet   Commonly known as: CIPRO   Take 1 tablet (500 mg total) by mouth 2 (two) times daily.      cyclobenzaprine 10 MG tablet   Commonly known as: FLEXERIL   Take 10 mg by mouth 3 (three) times daily as needed. Muscle spasm      lisinopril 5 MG tablet   Commonly known as: PRINIVIL,ZESTRIL   Take 5 mg by mouth daily.      metroNIDAZOLE 500 MG tablet   Commonly known as: FLAGYL   Take 1 tablet (500 mg total) by mouth 3 (three) times daily.      ondansetron 4 MG tablet   Commonly known as: ZOFRAN   Take 1 tablet (4 mg total) by mouth every 8 (eight) hours as needed for nausea.      traMADol 50 MG tablet   Commonly known as: ULTRAM   Take 50 mg by mouth 4 (four) times daily.           Follow-up Information    Follow up with [facility]. On 02/16/2012. (Follow up with Terie Purser Oct- 30th @ 10:45)    Contact information:   [address] [place]anne Moron [place] [id number] [phone]          The results of significant diagnostics from this hospitalization (including imaging, microbiology, ancillary and laboratory) are listed below for reference.    Significant Diagnostic Studies: Ct Abdomen Pelvis Wo Contrast  02/07/2012  *RADIOLOGY REPORT*  Clinical Data: Left flank pain  CT ABDOMEN AND PELVIS WITHOUT CONTRAST  Technique:  Multidetector CT imaging of the abdomen and pelvis was performed following the standard protocol without intravenous contrast.  Comparison: None.  Findings: Limited images through the lung bases demonstrate no significant appreciable abnormality. The heart  size is within normal limits. No pleural or pericardial effusion.  Organ abnormality/lesion detection is limited in the absence of intravenous contrast. Within this limitation, unremarkable liver, spleen, pancreas, adrenal glands.  Gallstones along the gallbladder fundus.  No biliary ductal dilatation.  Symmetric renal size.  No hydronephrosis or hydroureter.  No ureteral calculi.  No bowel obstruction.  Sigmoid colon diverticulosis.  Pericolonic fat stranding about the sigmoid colon/descending colon junction, in keeping with acute diverticulitis.  No free intraperitoneal air or fluid.  No lymphadenopathy.  Decompressed bladder.  Unremarkable CT appearance to the uterus and adnexa.  There is scattered atherosclerotic calcification of the aorta and its branches. No aneurysmal dilatation.  L5 S1 degenerative disc disease.  No acute osseous finding.  IMPRESSION: Acute diverticulitis at the sigmoid colon/descending colon junction.  Age appropriate colonoscopy screening recommended if not yet performed to  exclude an underlying lesion.  No hydronephrosis or urinary tract calculi.  Gallstones.  No CT evidence for cholecystitis.   Original Report Authenticated By: Waneta Martins, M.D.     Microbiology: Recent Results (from the past 240 hour(s))  URINE CULTURE     Status: Normal   Collection Time   02/06/12 10:55 PM      Component Value Range Status Comment   Specimen Description URINE, CLEAN CATCH   Final    Special Requests MACROBID   Final    Culture  Setup Time 02/08/2012 01:21   Final    Colony Count 40,000 COLONIES/ML   Final    Culture ESCHERICHIA COLI   Final    Report Status 02/09/2012 FINAL   Final    Organism ID, Bacteria ESCHERICHIA COLI   Final      Labs: Basic Metabolic Panel:  Lab 02/08/12 1610 02/07/12 1939 02/06/12 2312  NA 136 -- 137  K 4.1 -- 3.2*  CL 104 -- 101  CO2 24 -- 25  GLUCOSE 133* -- 102*  BUN 11 -- 18  CREATININE 0.81 0.73 0.80  CALCIUM 9.1 -- 9.6  MG -- -- --    PHOS -- -- --   Liver Function Tests:  Lab 02/08/12 0526  AST 13  ALT 13  ALKPHOS 67  BILITOT 0.3  PROT 7.0  ALBUMIN 3.4*   No results found for this basename: LIPASE:5,AMYLASE:5 in the last 168 hours No results found for this basename: AMMONIA:5 in the last 168 hours CBC:  Lab 02/08/12 0526 02/07/12 1939 02/06/12 2312  WBC 5.2 5.0 7.2  NEUTROABS -- -- 2.9  HGB 11.1* 11.6* 12.0  HCT 35.2* 35.8* 37.2  MCV 71.8* 71.0* 71.4*  PLT 303 282 295   Cardiac Enzymes: No results found for this basename: CKTOTAL:5,CKMB:5,CKMBINDEX:5,TROPONINI:5 in the last 168 hours BNP: BNP (last 3 results) No results found for this basename: PROBNP:3 in the last 8760 hours CBG: No results found for this basename: GLUCAP:5 in the last 168 hours     Signed:  Lamontae Ricardo  [facility] 02/09/2012, 5:53 PM

## 2012-02-10 ENCOUNTER — Ambulatory Visit (INDEPENDENT_AMBULATORY_CARE_PROVIDER_SITE_OTHER): Payer: BC Managed Care – PPO | Admitting: Gastroenterology

## 2012-02-10 ENCOUNTER — Encounter: Payer: Self-pay | Admitting: Gastroenterology

## 2012-02-10 VITALS — BP 136/87 | HR 70 | Temp 98.1°F | Ht 61.0 in | Wt 176.8 lb

## 2012-02-10 DIAGNOSIS — K5732 Diverticulitis of large intestine without perforation or abscess without bleeding: Secondary | ICD-10-CM

## 2012-02-10 DIAGNOSIS — K5792 Diverticulitis of intestine, part unspecified, without perforation or abscess without bleeding: Secondary | ICD-10-CM

## 2012-02-10 MED ORDER — PEG 3350-KCL-NA BICARB-NACL 420 G PO SOLR: 4000.0000 mL | ORAL | Status: DC

## 2012-02-10 NOTE — Patient Instructions (Addendum)
Follow a low-residue diet for the next 5-7 days.   After this, increase to a high fiber diet. Avoid constipation. Call us if worsening pain, nausea, vomiting, fever, chills.   Otherwise, we have set you up for a colonoscopy with Dr. Darrick Penna in the near future.   FOLLOW THIS FOR THE NEXT 5-7 days: Low-Fiber Diet Fiber is found in fruits, vegetables, and grains. A low-fiber diet restricts fibrous foods that are not digested in the small intestine. A diet containing about 10 grams of fiber is considered low fiber.   PURPOSE  To prevent blockage of a partially obstructed or narrowed gastrointestinal tract.   To reduce fecal weight and volume.   To slow the movement of feces.  WHEN IS THIS DIET USED?  It may be used during the acute phase of Crohn disease, ulcerative colitis, regional enteritis, or diverticulitis.   It may be used if your intestinal or esophageal tubes are narrowing (stenosis).   It may be used as a transitional diet following surgery, injury (trauma), or illness.  CHOOSING FOODS Check labels, especially on foods from the starch list. Often times, dietary fiber content is listed on the nutrition facts panel. Please ask your Registered Dietitian if you have questions about specific foods that are related to your condition, especially if the food is not listed on this handout. Breads and Starches  Allowed: White, [place], and pita breads, plain rolls, buns, or sweet rolls, doughnuts, waffles, pancakes, bagels. Plain muffins, biscuits, matzoth. Soda, saltine, graham crackers. Pretzels, rusks, melba toast, zwieback. Cooked cereals: cornmeal, farina, or cream cereals. Dry cereals: refined corn, wheat, rice, and oat cereals (check label). Potatoes prepared any way without skins, refined macaroni, spaghetti, noodles, refined rice.   Avoid: Whole-wheat bread, rolls, and crackers. Multigrains, rye, bran seeds, nuts, or coconut. Cereals containing whole grains, multigrains, bran,  coconut, nuts, raisins. Cooked or dry oatmeal. Coarse wheat cereals, granola. Cereals advertised as "high fiber." Potato skins. Whole-grain pasta, wild or brown rice. Popcorn.  Vegetables  Allowed: Strained tomato and vegetable juices. Fresh lettuce, cucumber, spinach. Well-cooked or canned: asparagus, bean sprouts, broccoli, cut green beans, cauliflower, pumpkin, beets, mushrooms, olives, yellow squash, tomato, tomato sauce, zucchini, turnips. Keep servings limited to  cup.   Avoid: Fresh, cooked, or canned: artichokes, baked beans, beet greens, Brussels sprouts, corn, kale, legumes, peas, sweet potatoes. Avoid large servings of any vegetables.  Fruit  Allowed: All fruit juices except prune juice. Cooked or canned fruits without skin and seeds: apricots, applesauce, cantaloupe, cherries, grapefruit, grapes, kiwi, mandarin oranges, peaches, pears, fruit cocktail, pineapple, plums, watermelon. Fresh without skin: banana, grapes, cantaloupe, avocado, cherries, pineapple, kiwi, nectarines, peaches, blueberries. Keep servings limited to  cup or 1 piece.   Avoid: Fresh: apples with or without skin, apricots, mangoes, pears, raspberries, strawberries. Prune juice and juices with pulp, stewed or dried prunes. Dried fruits, raisins, dates. Avoid large servings of all fresh fruits.  Meat and Protein Substitutes  Allowed: Ground or well-cooked tender beef, ham, veal, lamb, pork, poultry. Eggs, plain cheese. Fish, oysters, shrimp, lobster, other seafood. Liver, organ meats. Smooth nut butters.   Avoid: Tough, fibrous meats with gristle. Chunky nut butter. Cheese with seeds, nuts, or other foods not allowed. Nuts, seeds, legumes, dried peas, beans, lentils.  Dairy  Allowed: All milk products except those not allowed.   Avoid: Yogurt or cheese that contains nuts, seeds, or added fruit.   Soups and Combination Foods  Allowed: Bouillon, broth, or cream soups made from allowed foods. Any  strained soup.  Casseroles or mixed dishes made with allowed foods.   Avoid: Soups made from vegetables that are not allowed or that contain other foods not allowed.  Desserts and Sweets  Allowed: Plain cakes and cookies, pie made with allowed fruit, pudding, custard, cream pie. Gelatin, fruit, ice, sherbet, frozen ice pops. Ice cream, ice milk without nuts. Plain hard candy, honey, jelly, molasses, syrup, sugar, chocolate syrup, gumdrops, marshmallows.   Avoid: Desserts, cookies, or candies that contain nuts, peanut butter, dried fruits. Jams, preserves with seeds, marmalade.  Fats and Oils  Allowed: Margarine, butter, cream, mayonnaise, salad oils, plain salad dressings made from allowed foods.   Avoid: Seeds, nuts, olives.   Beverages  Allowed: All, except those listed to avoid.   Avoid: Fruit juices with high pulp, prune juice.  Condiments  Allowed: Ketchup, mustard, horseradish, vinegar, cream sauce, cheese sauce, cocoa powder. Spices in moderation: allspice, basil, bay leaves, celery powder or leaves, cinnamon, cumin powder, curry powder, ginger, mace, marjoram, onion or garlic powder, oregano, paprika, parsley flakes, ground pepper, rosemary, sage, savory, tarragon, thyme, turmeric.   Avoid: Coconut, pickles.  SAMPLE MENU Breakfast   cup orange juice.   1 boiled egg.   1 slice white toast.   Margarine.    cup cornflakes.   1 cup milk.   Beverage.  Lunch   cup chicken noodle soup.   2 to 3 oz sliced roast beef.   2 slices white bread.   Mayonnaise.    cup tomato juice.   1 small banana.   Beverage.  Dinner  3 oz baked chicken.    cup scalloped potatoes.    cup cooked beets.   White dinner roll.   Margarine.    cup canned peaches.   Beverage.  Document Released: 09/25/2001 Document Revised: 06/28/2011 Document Reviewed: 04/22/2011 [facility] Patient Information 2013 [place], [place].        Start on Oct 31: High-Fiber Diet Fiber is found in  fruits, vegetables, and grains. A high-fiber diet encourages the addition of more whole grains, legumes, fruits, and vegetables in your diet. The recommended amount of fiber for adult males is 38 g per day. For adult females, it is 25 g per day. Pregnant and lactating women should get 28 g of fiber per day. If you have a digestive or bowel problem, ask your caregiver for advice before adding high-fiber foods to your diet. Eat a variety of high-fiber foods instead of only a select few type of foods.   PURPOSE  To increase stool bulk.   To make bowel movements more regular to prevent constipation.   To lower cholesterol.   To prevent overeating.  WHEN IS THIS DIET USED?  It may be used if you have constipation and hemorrhoids.   It may be used if you have uncomplicated diverticulosis (intestine condition) and irritable bowel syndrome.   It may be used if you need help with weight management.   It may be used if you want to add it to your diet as a protective measure against atherosclerosis, diabetes, and cancer.  SOURCES OF FIBER  Whole-grain breads and cereals.   Fruits, such as apples, oranges, bananas, berries, prunes, and pears.   Vegetables, such as green peas, carrots, sweet potatoes, beets, broccoli, cabbage, spinach, and artichokes.   Legumes, such split peas, soy, lentils.   Almonds.  FIBER CONTENT IN FOODS Starches and Grains / Dietary Fiber (g)  Cheerios, 1 cup / 3 g   Corn Flakes cereal,  1 cup / 0.7 g   Rice crispy treat cereal, 1 cup / 0.3 g   Instant oatmeal (cooked),  cup / 2 g   Frosted wheat cereal, 1 cup / 5.1 g   Brown, long-grain rice (cooked), 1 cup / 3.5 g   White, long-grain rice (cooked), 1 cup / 0.6 g   Enriched macaroni (cooked), 1 cup / 2.5 g  Legumes / Dietary Fiber (g)  Baked beans (canned, plain, or vegetarian),  cup / 5.2 g   Kidney beans (canned),  cup / 6.8 g   Pinto beans (cooked),  cup / 5.5 g  Breads and Crackers / Dietary  Fiber (g)  Plain or honey graham crackers, 2 squares / 0.7 g   Saltine crackers, 3 squares / 0.3 g   Plain, salted pretzels, 10 pieces / 1.8 g   Whole-wheat bread, 1 slice / 1.9 g   White bread, 1 slice / 0.7 g   Raisin bread, 1 slice / 1.2 g   Plain bagel, 3 oz / 2 g   Flour tortilla, 1 oz / 0.9 g   Corn tortilla, 1 small / 1.5 g   Hamburger or hotdog bun, 1 small / 0.9 g  Fruits / Dietary Fiber (g)  Apple with skin, 1 medium / 4.4 g   Sweetened applesauce,  cup / 1.5 g   Banana,  medium / 1.5 g   Grapes, 10 grapes / 0.4 g   Orange, 1 small / 2.3 g   Raisin, 1.5 oz / 1.6 g   Melon, 1 cup / 1.4 g  Vegetables / Dietary Fiber (g)  Green beans (canned),  cup / 1.3 g   Carrots (cooked),  cup / 2.3 g   Broccoli (cooked),  cup / 2.8 g   Peas (cooked),  cup / 4.4 g   Mashed potatoes,  cup / 1.6 g   Lettuce, 1 cup / 0.5 g   Corn (canned),  cup / 1.6 g   Tomato,  cup / 1.1 g  Document Released: 04/05/2005 Document Revised: 10/05/2011 Document Reviewed: 07/08/2011 [facility] Patient Information 2013 [place], [place].

## 2012-02-10 NOTE — Progress Notes (Signed)
Referring Provider: Lenise Herald, PA Primary Care Physician:  Lenise Herald, PA Primary Gastroenterologist:  Dr. Darrick Penna   Chief Complaint  Patient presents with  . Diverticulitis    HPI:   61 year old pleasant female who presents today after recent admission for first episode uncomplicated diverticulitis. CT showed acute diverticulitis at sigmoid and descending colon. +gallstones but not evidence for cholecystitis.  Last TCS > 10 years ago in [place]. Unsure results. Just discharged from hospital. Mild LLQ abdominal discomfort, continues to improve. Zofran for nausea. Denies constipation or diarrhea. No rectal bleeding. No wt loss or lack of appetite. +heartburn rare. Doesn't feel like has to take something for it.   Past Medical History  Diagnosis Date  . Hypertension   . Diverticulitis   . Gallstones 02/09/2012  . Hypercholesterolemia     in past    Past Surgical History  Procedure Date  . Tubal ligation   . Bunionectomy   . Colonoscopy     [place], remote past.     Current Outpatient Prescriptions  Medication Sig Dispense Refill  . ciprofloxacin (CIPRO) 500 MG tablet Take 1 tablet (500 mg total) by mouth 2 (two) times daily.  14 tablet  0  . cyclobenzaprine (FLEXERIL) 10 MG tablet Take 10 mg by mouth 3 (three) times daily as needed. Muscle spasm      . HYDROcodone-acetaminophen (NORCO/VICODIN) 5-325 MG per tablet Take 1 tablet by mouth every 6 (six) hours as needed.      Marland Kitchen lisinopril (PRINIVIL,ZESTRIL) 5 MG tablet Take 5 mg by mouth daily.      . metroNIDAZOLE (FLAGYL) 500 MG tablet Take 1 tablet (500 mg total) by mouth 3 (three) times daily.  7 tablet  0  . ondansetron (ZOFRAN) 4 MG tablet Take 1 tablet (4 mg total) by mouth every 8 (eight) hours as needed for nausea.  20 tablet  0  . traMADol (ULTRAM) 50 MG tablet Take 50 mg by mouth 4 (four) times daily.        Allergies as of 02/10/2012 - Review Complete 02/10/2012  Allergen Reaction Noted  . Penicillins  Other (See Comments) 02/06/2012    Family History  Problem Relation Age of Onset  . Colon cancer Neg Hx     History   Social History  . Marital Status: Divorced    Spouse Name: N/A    Number of Children: 7  . Years of Education: N/A   Occupational History  . [employer]     custodian   Social History Main Topics  . Smoking status: Former Games developer  . Smokeless tobacco: Not on file  . Alcohol Use: Yes     social  . Drug Use: No  . Sexually Active: Yes    Birth Control/ Protection: Surgical   Other Topics Concern  . Not on file   Social History Narrative   4 birth children, 3 adopted. Youngest is 22.     Review of Systems: Gen: Denies any fever, chills, loss of appetite, fatigue, weight loss. CV: Denies chest pain, heart palpitations, syncope, peripheral edema. Resp: Denies shortness of breath with rest, cough, wheezing GI: Denies dysphagia or odynophagia. Denies hematemesis, fecal incontinence, or jaundice.  GU : Denies urinary burning, urinary frequency, urinary incontinence.  MS: Denies joint pain, muscle weakness, cramps, limited movement Derm: Denies rash, itching, dry skin Psych: + mild situational depression due to hospitalization, out of work Heme: Denies bruising, bleeding, and enlarged lymph nodes.  Physical Exam: BP 136/87  Pulse  70  Temp 98.1 F (36.7 C) (Temporal)  Ht 5\' 1"  (1.549 m)  Wt 176 lb 12.8 oz (80.196 kg)  BMI 33.41 kg/m2 General:   Alert and oriented. Well-developed, well-nourished, pleasant and cooperative. Head:  Normocephalic and atraumatic. Eyes:  Conjunctiva pink, sclera clear, no icterus.   Conjunctiva pink. Ears:  Normal auditory acuity. Nose:  No deformity, discharge,  or lesions. Mouth:  No deformity or lesions, mucosa pink and moist.  Lungs:  Clear to auscultation bilaterally, without wheezing, rales, or rhonchi.  Heart:  S1, S2 present without murmurs noted.  Abdomen:  +BS, soft, non-tender and non-distended.  Without mass or HSM. No rebound or guarding. No hernias noted. Rectal:  Deferred  Msk:  Symmetrical without gross deformities. Normal posture. Extremities:  Without clubbing or edema. Neurologic:  Alert and  oriented x4;  grossly normal neurologically. Skin:  Intact, warm and dry without significant lesions or rashes Psych:  Alert and cooperative. Normal mood and affect.

## 2012-02-10 NOTE — Assessment & Plan Note (Addendum)
61 year old female with recent diagnosis of uncomplicated sigmoid and colon diverticulitis (CT documented). Last colonoscopy > 10 years ago in [place]. Clinically improving. Needs lower GI evaluation to r/o underlying occult process as well as routine screening colonoscopy. No FH of colon cancer.   Continue low residue diet for the next 5-7 days Increase to High Fiber diet next Thursday Signs/Symptoms reviewed to report.  Proceed with colonoscopy with Dr. Darrick Penna in the near future, after current episode resolved. The risks, benefits, and alternatives have been discussed in detail with the patient. They state understanding and desire to proceed.

## 2012-02-10 NOTE — Progress Notes (Signed)
Faxed to PCP

## 2012-02-21 NOTE — ED Provider Notes (Addendum)
History     CSN: [id number]  Arrival date & time 02/07/12  1519   First MD Initiated Contact with Patient 02/07/12 1528      Chief Complaint  Patient presents with  . Emesis    (Consider location/radiation/quality/duration/timing/severity/associated sxs/prior treatment) HPI Comments: Pt c/o L upper and lower quad abd pain.  Has been dx with diverticulitis and prescribed abx but "can't keep them down".  Patient is a 61 y.o. female presenting with vomiting. The history is provided by the patient. No language interpreter was used.  Emesis  This is a new problem. Episode onset: several days ago. Pertinent negatives include no chills, no diarrhea and no fever.    Past Medical History  Diagnosis Date  . Hypertension   . Diverticulitis   . Gallstones 02/09/2012  . Hypercholesterolemia     in past    Past Surgical History  Procedure Date  . Tubal ligation   . Bunionectomy   . Colonoscopy     [place], remote past.     Family History  Problem Relation Age of Onset  . Colon cancer Neg Hx     History  Substance Use Topics  . Smoking status: Former Games developer  . Smokeless tobacco: Not on file  . Alcohol Use: Yes     Comment: social    OB History    Grav Para Term Preterm Abortions TAB SAB Ect Mult Living                  Review of Systems  Constitutional: Negative for fever and chills.  Gastrointestinal: Positive for nausea and vomiting. Negative for diarrhea and blood in stool.  All other systems reviewed and are negative.    Allergies  Penicillins  Home Medications   Current Outpatient Rx  Name  Route  Sig  Dispense  Refill  . LISINOPRIL 5 MG PO TABS   Oral   Take 5 mg by mouth daily.         Marland Kitchen PEG 3350-KCL-NA BICARB-NACL 420 G PO SOLR   Oral   Take 4,000 mLs by mouth as directed.   4000 mL   0     BP 126/78  Pulse 68  Temp 98.2 F (36.8 C) (Oral)  Resp 18  Ht 5\' 1"  (1.549 m)  Wt 180 lb 5.4 oz (81.8 kg)  BMI 34.07 kg/m2  SpO2  95%  Physical Exam  Nursing note and vitals reviewed. Constitutional: She is oriented to person, place, and time. She appears well-developed and well-nourished. No distress.  HENT:  Head: Normocephalic and atraumatic.  Eyes: EOM are normal.  Neck: Normal range of motion.  Cardiovascular: Normal rate and regular rhythm.   Pulmonary/Chest: Effort normal and breath sounds normal. No respiratory distress.  Abdominal: Soft. Normal appearance and bowel sounds are normal. She exhibits no distension and no mass. There is no hepatosplenomegaly. There is tenderness in the left upper quadrant and left lower quadrant. There is no rebound, no guarding, no CVA tenderness, no tenderness at McBurney's point and negative Murphy's sign.    Musculoskeletal: Normal range of motion.  Neurological: She is alert and oriented to person, place, and time.  Skin: Skin is warm and dry.  Psychiatric: She has a normal mood and affect. Judgment normal.    ED Course  Procedures (including critical care time)  Labs Reviewed  CBC - Abnormal; Notable for the following:    Hemoglobin 11.6 (*)     HCT 35.8 (*)  MCV 71.0 (*)     MCH 23.0 (*)     All other components within normal limits  CREATININE, SERUM - Abnormal; Notable for the following:    GFR calc non Af Amer 90 (*)     All other components within normal limits  COMPREHENSIVE METABOLIC PANEL - Abnormal; Notable for the following:    Glucose, Bld 133 (*)     Albumin 3.4 (*)     GFR calc non Af Amer 76 (*)     GFR calc Af Amer 88 (*)     All other components within normal limits  CBC - Abnormal; Notable for the following:    Hemoglobin 11.1 (*)     HCT 35.2 (*)     MCV 71.8 (*)     MCH 22.7 (*)     All other components within normal limits  LAB REPORT - SCANNED   No results found.   1. Diverticulitis   2. Dehydration   3. Hypertension   4. Hypokalemia   5. Nausea & vomiting   6. Gallstones       MDM          Evalina Field,  PA 02/21/12 1840  Evalina Field, [place] 02/29/12 [phone]

## 2012-02-23 NOTE — ED Provider Notes (Signed)
Medical screening examination/treatment/procedure(s) were performed by non-physician practitioner and as supervising physician I was immediately available for consultation/collaboration.  Geoffery Lyons, MD 02/23/12 [phone]

## 2012-02-28 ENCOUNTER — Encounter (HOSPITAL_COMMUNITY): Payer: Self-pay | Admitting: Pharmacy Technician

## 2012-03-01 MED ORDER — SODIUM CHLORIDE 0.45 % IV SOLN
INTRAVENOUS | Status: DC
  Administered 2012-03-02: 1000 mL via INTRAVENOUS

## 2012-03-01 NOTE — ED Provider Notes (Signed)
Medical screening examination/treatment/procedure(s) were performed by non-physician practitioner and as supervising physician I was immediately available for consultation/collaboration.  Rashon Westrup S. Talishia Betzler, MD 03/01/12 0631 

## 2012-03-02 ENCOUNTER — Encounter (HOSPITAL_COMMUNITY)
Admission: RE | Disposition: A | Discharge status: Home / Self Care | Payer: Self-pay | Source: Ambulatory Visit | Attending: Gastroenterology

## 2012-03-02 ENCOUNTER — Ambulatory Visit (HOSPITAL_COMMUNITY)
Admission: RE | Admit: 2012-03-02 | Discharge: 2012-03-02 | Disposition: A | Discharge status: Home / Self Care | Payer: BC Managed Care – PPO | Source: Ambulatory Visit | Attending: Gastroenterology | Admitting: Gastroenterology

## 2012-03-02 ENCOUNTER — Encounter (HOSPITAL_COMMUNITY): Payer: Self-pay | Admitting: *Deleted

## 2012-03-02 DIAGNOSIS — K648 Other hemorrhoids: Secondary | ICD-10-CM | POA: Insufficient documentation

## 2012-03-02 DIAGNOSIS — D129 Benign neoplasm of anus and anal canal: Secondary | ICD-10-CM | POA: Insufficient documentation

## 2012-03-02 DIAGNOSIS — Z1211 Encounter for screening for malignant neoplasm of colon: Secondary | ICD-10-CM

## 2012-03-02 DIAGNOSIS — K5730 Diverticulosis of large intestine without perforation or abscess without bleeding: Secondary | ICD-10-CM

## 2012-03-02 DIAGNOSIS — I10 Essential (primary) hypertension: Secondary | ICD-10-CM | POA: Insufficient documentation

## 2012-03-02 DIAGNOSIS — K620 Anal polyp: Secondary | ICD-10-CM

## 2012-03-02 DIAGNOSIS — K5792 Diverticulitis of intestine, part unspecified, without perforation or abscess without bleeding: Secondary | ICD-10-CM

## 2012-03-02 DIAGNOSIS — K621 Rectal polyp: Secondary | ICD-10-CM

## 2012-03-02 DIAGNOSIS — E7800 Pure hypercholesterolemia, unspecified: Secondary | ICD-10-CM | POA: Insufficient documentation

## 2012-03-02 DIAGNOSIS — D128 Benign neoplasm of rectum: Secondary | ICD-10-CM | POA: Insufficient documentation

## 2012-03-02 HISTORY — PX: COLONOSCOPY: SHX5424

## 2012-03-02 SURGERY — COLONOSCOPY
Anesthesia: Moderate Sedation

## 2012-03-02 MED ORDER — MEPERIDINE HCL 100 MG/ML IJ SOLN
INTRAMUSCULAR | Status: AC
  Filled 2012-03-02: qty 1

## 2012-03-02 MED ORDER — MIDAZOLAM HCL 5 MG/5ML IJ SOLN
INTRAMUSCULAR | Status: DC | PRN
  Administered 2012-03-02: 2 mg via INTRAVENOUS
  Administered 2012-03-02: 1 mg via INTRAVENOUS
  Administered 2012-03-02: 2 mg via INTRAVENOUS

## 2012-03-02 MED ORDER — MIDAZOLAM HCL 5 MG/5ML IJ SOLN
INTRAMUSCULAR | Status: AC
  Filled 2012-03-02: qty 10

## 2012-03-02 MED ORDER — MEPERIDINE HCL 100 MG/ML IJ SOLN
INTRAMUSCULAR | Status: DC | PRN
  Administered 2012-03-02 (×2): 25 mg via INTRAVENOUS

## 2012-03-02 MED ORDER — STERILE WATER FOR IRRIGATION IR SOLN
Status: DC | PRN
  Administered 2012-03-02: 09:00:00

## 2012-03-02 NOTE — Op Note (Signed)
[facility] [address] [place], [address]   COLONOSCOPY PROCEDURE REPORT  PATIENT: Selena Christian, Selena Christian  MR#: [id number] BIRTHDATE: 1950/12/14 , 61  yrs. old GENDER: Female ENDOSCOPIST: Jonette Eva, MD REFERRED [id number] Regino Schultze, M.D. , [place], [place] PROCEDURE DATE:  03/02/2012 PROCEDURE:   Colonoscopy with cold biopsy polypectomy INDICATIONS:average risk patient for colon cancer.  OCT 2013: [place] DIVERTICULITIS ON CT MEDICATIONS: Demerol 50 mg IV and Versed 5 mg IV  DESCRIPTION OF PROCEDURE:    Physical exam was performed.  Informed consent was obtained from the patient after explaining the benefits, risks, and alternatives to procedure.  The patient was connected to monitor and placed in left lateral position. Continuous oxygen was provided by nasal cannula and IV medicine administered through an indwelling cannula.  After administration of sedation and rectal exam, the patients rectum was intubated and the Pentax Colonoscope [phone]  colonoscope was advanced under direct visualization to the cecum.  The scope was removed slowly by carefully examining the color, texture, anatomy, and integrity mucosa on the way out.  The patient was recovered in endoscopy and discharged home in satisfactory condition.       COLON FINDINGS: A sessile polyp measuring 3 mm in size was found in the rectum.  A polypectomy was performed with cold forceps.  , Moderate sized internal hemorrhoids were found.  , and Diverticulosis was noted throughout the entire examined colon.  PREP QUALITY: good. CECAL W/D TIME: 12 minutes  COMPLICATIONS: None  ENDOSCOPIC IMPRESSION: 1.   Sessile polyp measuring 3 mm in size was found in the rectum; polypectomy was performed with cold forceps 2.   Moderate sized internal hemorrhoids 3.  MODERATE PANCOLONIC DIVERTICULOSIS   RECOMMENDATIONS: HIGH FIBER DIET AWAIT BIOPSY TCS IN 10  YEARS       _______________________________ eSignedJonette Eva, MD 03/02/2012 10:41 AM

## 2012-03-02 NOTE — H&P (Signed)
  Primary Care Physician:  Lenise Herald, [place] Primary Gastroenterologist:  Dr. Darrick Penna  Pre-Procedure History & Physical: HPI:  Selena Christian is a 61 y.o. female here for COLON CANCER SCREENING.  Past Medical History  Diagnosis Date  . Hypertension   . Diverticulitis   . Gallstones 02/09/2012  . Hypercholesterolemia     in past    Past Surgical History  Procedure Date  . Tubal ligation   . Bunionectomy   . Colonoscopy     [place], remote past.     Prior to Admission medications   Medication Sig Start Date End Date Taking? Authorizing Provider  lisinopril (PRINIVIL,ZESTRIL) 5 MG tablet Take 5 mg by mouth daily.   Yes Historical Provider, MD    Allergies as of 02/10/2012 - Review Complete 02/10/2012  Allergen Reaction Noted  . Penicillins Other (See Comments) 02/06/2012    Family History  Problem Relation Age of Onset  . Colon cancer Neg Hx     History   Social History  . Marital Status: Divorced    Spouse Name: N/A    Number of Children: 7  . Years of Education: N/A   Occupational History  . [employer]     custodian   Social History Main Topics  . Smoking status: Former Games developer  . Smokeless tobacco: Not on file  . Alcohol Use: Yes     Comment: social  . Drug Use: No  . Sexually Active: Yes    Birth Control/ Protection: Surgical   Other Topics Concern  . Not on file   Social History Narrative   4 birth children, 3 adopted. Youngest is 22.     Review of Systems: See HPI, otherwise negative ROS   Physical Exam: BP 161/95  Pulse 87  Temp 98.2 F (36.8 C) (Oral)  Resp 20  SpO2 99% General:   Alert,  pleasant and cooperative in NAD Head:  Normocephalic and atraumatic. Neck:  Supple; Lungs:  Clear throughout to auscultation.    Heart:  Regular rate and rhythm. Abdomen:  Soft, nontender and nondistended. Normal bowel sounds, without guarding, and without rebound.   Neurologic:  Alert and  oriented x4;  grossly normal  neurologically.  Impression/Plan:     SCREENING  Plan:  1. TCS TODAY

## 2012-03-02 NOTE — Progress Notes (Signed)
TCS NOV 2013 RECTAL POLYP, pTICS, IH  REVIEWED.

## 2012-03-04 NOTE — ED Provider Notes (Signed)
Medical screening examination/treatment/procedure(s) were performed by non-physician practitioner and as supervising physician I was immediately available for consultation/collaboration.  Nicoletta Dress. Colon Branch, MD 03/04/12 [phone]

## 2012-03-06 ENCOUNTER — Encounter (HOSPITAL_COMMUNITY): Payer: Self-pay | Admitting: Gastroenterology

## 2012-03-06 ENCOUNTER — Telehealth: Payer: Self-pay | Admitting: Gastroenterology

## 2012-03-06 NOTE — Telephone Encounter (Signed)
Results faxed to PCP, recall made  

## 2012-03-06 NOTE — Telephone Encounter (Signed)
LMOM to call.

## 2012-03-06 NOTE — Telephone Encounter (Signed)
Please call pt. She had HYPERPLASTIC POLYPs removed from her colon.  High fiber diet. TCS in 10 years.

## 2012-03-06 NOTE — Telephone Encounter (Signed)
Pt returned call and was informed of her results.  

## 2012-03-07 ENCOUNTER — Telehealth: Payer: Self-pay

## 2012-03-07 NOTE — Telephone Encounter (Signed)
Pt came by the office and needs a note saying she can return to work on 03/13/2012.  ( Even though her work note was from 10/22/ -11/22 her employer requires a note stating she can go back to work on 03/13/2012. I told her Dr. Darrick Penna will be in the office tomorrow and I will call her when it is ready.

## 2012-03-08 NOTE — Telephone Encounter (Addendum)
HAVE PT PICK UP RTW ON 11/25 NOTE TODAY.

## 2012-03-08 NOTE — Telephone Encounter (Signed)
LMOM for pt to come pick up note.

## 2012-03-22 ENCOUNTER — Other Ambulatory Visit (HOSPITAL_COMMUNITY): Payer: Self-pay | Admitting: Family Medicine

## 2012-03-22 DIAGNOSIS — Z139 Encounter for screening, unspecified: Secondary | ICD-10-CM

## 2012-03-28 ENCOUNTER — Ambulatory Visit (HOSPITAL_COMMUNITY)
Admission: RE | Admit: 2012-03-28 | Discharge: 2012-03-28 | Disposition: A | Discharge status: Home / Self Care | Payer: BC Managed Care – PPO | Source: Ambulatory Visit | Attending: Family Medicine | Admitting: Family Medicine

## 2012-03-28 DIAGNOSIS — Z1231 Encounter for screening mammogram for malignant neoplasm of breast: Secondary | ICD-10-CM | POA: Insufficient documentation

## 2012-03-28 DIAGNOSIS — Z139 Encounter for screening, unspecified: Secondary | ICD-10-CM

## 2012-12-11 ENCOUNTER — Ambulatory Visit: Payer: Self-pay | Admitting: Physician Assistant

## 2013-12-11 ENCOUNTER — Other Ambulatory Visit (HOSPITAL_COMMUNITY): Payer: Self-pay | Admitting: Physician Assistant

## 2013-12-11 DIAGNOSIS — Z139 Encounter for screening, unspecified: Secondary | ICD-10-CM

## 2013-12-17 ENCOUNTER — Ambulatory Visit (HOSPITAL_COMMUNITY)
Admission: RE | Admit: 2013-12-17 | Discharge: 2013-12-17 | Disposition: A | Discharge status: Home / Self Care | Payer: BC Managed Care – PPO | Source: Ambulatory Visit | Attending: Physician Assistant | Admitting: Physician Assistant

## 2013-12-17 ENCOUNTER — Ambulatory Visit (HOSPITAL_COMMUNITY): Payer: BC Managed Care – PPO

## 2013-12-17 DIAGNOSIS — Z139 Encounter for screening, unspecified: Secondary | ICD-10-CM

## 2013-12-17 DIAGNOSIS — Z1231 Encounter for screening mammogram for malignant neoplasm of breast: Secondary | ICD-10-CM | POA: Diagnosis present

## 2015-07-31 ENCOUNTER — Other Ambulatory Visit (HOSPITAL_COMMUNITY): Payer: Self-pay | Admitting: Physician Assistant

## 2015-07-31 DIAGNOSIS — Z1231 Encounter for screening mammogram for malignant neoplasm of breast: Secondary | ICD-10-CM

## 2015-08-04 ENCOUNTER — Ambulatory Visit (HOSPITAL_COMMUNITY)
Admission: RE | Admit: 2015-08-04 | Discharge: 2015-08-04 | Disposition: A | Discharge status: Home / Self Care | Payer: Medicare Other | Source: Ambulatory Visit | Attending: Physician Assistant | Admitting: Physician Assistant

## 2015-08-04 DIAGNOSIS — Z1231 Encounter for screening mammogram for malignant neoplasm of breast: Secondary | ICD-10-CM | POA: Insufficient documentation

## 2015-08-06 DIAGNOSIS — Z1389 Encounter for screening for other disorder: Secondary | ICD-10-CM | POA: Diagnosis not present

## 2015-08-06 DIAGNOSIS — I10 Essential (primary) hypertension: Secondary | ICD-10-CM | POA: Diagnosis not present

## 2015-08-06 DIAGNOSIS — E782 Mixed hyperlipidemia: Secondary | ICD-10-CM | POA: Diagnosis not present

## 2016-01-16 DIAGNOSIS — H5203 Hypermetropia, bilateral: Secondary | ICD-10-CM | POA: Diagnosis not present

## 2016-01-16 DIAGNOSIS — H2513 Age-related nuclear cataract, bilateral: Secondary | ICD-10-CM | POA: Diagnosis not present

## 2016-01-16 DIAGNOSIS — Z135 Encounter for screening for eye and ear disorders: Secondary | ICD-10-CM | POA: Diagnosis not present

## 2016-01-16 DIAGNOSIS — H52223 Regular astigmatism, bilateral: Secondary | ICD-10-CM | POA: Diagnosis not present

## 2016-01-16 DIAGNOSIS — H524 Presbyopia: Secondary | ICD-10-CM | POA: Diagnosis not present

## 2016-02-03 DIAGNOSIS — E782 Mixed hyperlipidemia: Secondary | ICD-10-CM | POA: Diagnosis not present

## 2016-02-03 DIAGNOSIS — Z1389 Encounter for screening for other disorder: Secondary | ICD-10-CM | POA: Diagnosis not present

## 2016-02-03 DIAGNOSIS — I10 Essential (primary) hypertension: Secondary | ICD-10-CM | POA: Diagnosis not present

## 2016-02-03 DIAGNOSIS — Z23 Encounter for immunization: Secondary | ICD-10-CM | POA: Diagnosis not present

## 2016-03-24 DIAGNOSIS — H2511 Age-related nuclear cataract, right eye: Secondary | ICD-10-CM | POA: Diagnosis not present

## 2016-03-24 DIAGNOSIS — H2513 Age-related nuclear cataract, bilateral: Secondary | ICD-10-CM | POA: Diagnosis not present

## 2016-04-20 DIAGNOSIS — H2511 Age-related nuclear cataract, right eye: Secondary | ICD-10-CM | POA: Diagnosis not present

## 2016-04-21 DIAGNOSIS — H2512 Age-related nuclear cataract, left eye: Secondary | ICD-10-CM | POA: Diagnosis not present

## 2016-04-27 DIAGNOSIS — H2512 Age-related nuclear cataract, left eye: Secondary | ICD-10-CM | POA: Diagnosis not present

## 2016-12-24 DIAGNOSIS — I10 Essential (primary) hypertension: Secondary | ICD-10-CM | POA: Diagnosis not present

## 2017-03-07 DIAGNOSIS — M79632 Pain in left forearm: Secondary | ICD-10-CM | POA: Diagnosis not present

## 2017-03-07 DIAGNOSIS — Z0000 Encounter for general adult medical examination without abnormal findings: Secondary | ICD-10-CM | POA: Diagnosis not present

## 2017-03-07 DIAGNOSIS — Z23 Encounter for immunization: Secondary | ICD-10-CM | POA: Diagnosis not present

## 2017-03-16 DIAGNOSIS — Z1389 Encounter for screening for other disorder: Secondary | ICD-10-CM | POA: Diagnosis not present

## 2017-03-16 DIAGNOSIS — Z23 Encounter for immunization: Secondary | ICD-10-CM | POA: Diagnosis not present

## 2017-03-16 DIAGNOSIS — Z0000 Encounter for general adult medical examination without abnormal findings: Secondary | ICD-10-CM | POA: Diagnosis not present

## 2017-03-16 DIAGNOSIS — R739 Hyperglycemia, unspecified: Secondary | ICD-10-CM | POA: Diagnosis not present

## 2017-05-06 DIAGNOSIS — Z1389 Encounter for screening for other disorder: Secondary | ICD-10-CM | POA: Diagnosis not present

## 2017-05-06 DIAGNOSIS — R7309 Other abnormal glucose: Secondary | ICD-10-CM | POA: Diagnosis not present

## 2017-06-14 DIAGNOSIS — M8589 Other specified disorders of bone density and structure, multiple sites: Secondary | ICD-10-CM | POA: Diagnosis not present

## 2017-06-14 DIAGNOSIS — Z780 Asymptomatic menopausal state: Secondary | ICD-10-CM | POA: Diagnosis not present

## 2017-06-14 DIAGNOSIS — M8580 Other specified disorders of bone density and structure, unspecified site: Secondary | ICD-10-CM | POA: Diagnosis not present

## 2017-07-08 ENCOUNTER — Other Ambulatory Visit (HOSPITAL_COMMUNITY): Payer: Self-pay | Admitting: Family Medicine

## 2017-07-08 DIAGNOSIS — Z1231 Encounter for screening mammogram for malignant neoplasm of breast: Secondary | ICD-10-CM

## 2017-07-11 ENCOUNTER — Encounter (HOSPITAL_COMMUNITY): Payer: Self-pay

## 2017-07-11 ENCOUNTER — Ambulatory Visit (HOSPITAL_COMMUNITY)
Admission: RE | Admit: 2017-07-11 | Discharge: 2017-07-11 | Disposition: A | Discharge status: Home / Self Care | Payer: Medicare Other | Source: Ambulatory Visit | Attending: Family Medicine | Admitting: Family Medicine

## 2017-07-11 DIAGNOSIS — Z1231 Encounter for screening mammogram for malignant neoplasm of breast: Secondary | ICD-10-CM | POA: Diagnosis not present

## 2018-03-22 DIAGNOSIS — Z23 Encounter for immunization: Secondary | ICD-10-CM | POA: Diagnosis not present

## 2018-03-22 DIAGNOSIS — M7552 Bursitis of left shoulder: Secondary | ICD-10-CM | POA: Diagnosis not present

## 2018-03-22 DIAGNOSIS — Z1389 Encounter for screening for other disorder: Secondary | ICD-10-CM | POA: Diagnosis not present

## 2018-03-22 DIAGNOSIS — Z0001 Encounter for general adult medical examination with abnormal findings: Secondary | ICD-10-CM | POA: Diagnosis not present

## 2018-03-28 ENCOUNTER — Observation Stay (HOSPITAL_COMMUNITY)
Admission: EM | Admit: 2018-03-28 | Discharge: 2018-03-30 | Disposition: A | Discharge status: Home / Self Care | Payer: Medicare Other | Attending: General Surgery | Admitting: General Surgery

## 2018-03-28 ENCOUNTER — Encounter (HOSPITAL_COMMUNITY): Payer: Self-pay | Admitting: Emergency Medicine

## 2018-03-28 ENCOUNTER — Emergency Department (HOSPITAL_COMMUNITY): Payer: Medicare Other

## 2018-03-28 ENCOUNTER — Other Ambulatory Visit: Payer: Self-pay

## 2018-03-28 DIAGNOSIS — K760 Fatty (change of) liver, not elsewhere classified: Secondary | ICD-10-CM | POA: Diagnosis not present

## 2018-03-28 DIAGNOSIS — K8051 Calculus of bile duct without cholangitis or cholecystitis with obstruction: Secondary | ICD-10-CM | POA: Diagnosis not present

## 2018-03-28 DIAGNOSIS — R112 Nausea with vomiting, unspecified: Secondary | ICD-10-CM

## 2018-03-28 DIAGNOSIS — R1011 Right upper quadrant pain: Secondary | ICD-10-CM | POA: Diagnosis not present

## 2018-03-28 DIAGNOSIS — K8020 Calculus of gallbladder without cholecystitis without obstruction: Secondary | ICD-10-CM | POA: Diagnosis not present

## 2018-03-28 DIAGNOSIS — K8066 Calculus of gallbladder and bile duct with acute and chronic cholecystitis without obstruction: Secondary | ICD-10-CM | POA: Diagnosis not present

## 2018-03-28 DIAGNOSIS — K8000 Calculus of gallbladder with acute cholecystitis without obstruction: Secondary | ICD-10-CM | POA: Diagnosis not present

## 2018-03-28 DIAGNOSIS — R9389 Abnormal findings on diagnostic imaging of other specified body structures: Secondary | ICD-10-CM

## 2018-03-28 DIAGNOSIS — R079 Chest pain, unspecified: Secondary | ICD-10-CM | POA: Diagnosis not present

## 2018-03-28 DIAGNOSIS — Z79899 Other long term (current) drug therapy: Secondary | ICD-10-CM | POA: Diagnosis not present

## 2018-03-28 DIAGNOSIS — I10 Essential (primary) hypertension: Secondary | ICD-10-CM | POA: Diagnosis not present

## 2018-03-28 DIAGNOSIS — K8050 Calculus of bile duct without cholangitis or cholecystitis without obstruction: Secondary | ICD-10-CM

## 2018-03-28 DIAGNOSIS — Z880 Allergy status to penicillin: Secondary | ICD-10-CM | POA: Diagnosis not present

## 2018-03-28 DIAGNOSIS — Z87891 Personal history of nicotine dependence: Secondary | ICD-10-CM | POA: Insufficient documentation

## 2018-03-28 DIAGNOSIS — R109 Unspecified abdominal pain: Secondary | ICD-10-CM

## 2018-03-28 LAB — HEPATIC FUNCTION PANEL
ALK PHOS: 71 U/L (ref 38–126)
ALT: 17 U/L (ref 0–44)
AST: 17 U/L (ref 15–41)
Albumin: 4.7 g/dL (ref 3.5–5.0)
BILIRUBIN TOTAL: 0.5 mg/dL (ref 0.3–1.2)
Bilirubin, Direct: <0.1 mg/dL (ref 0.0–0.2)
Total Protein: 8.4 g/dL — ABNORMAL HIGH (ref 6.5–8.1)

## 2018-03-28 LAB — CBC
HEMATOCRIT: 39.8 % (ref 36.0–46.0)
Hemoglobin: 12.1 g/dL (ref 12.0–15.0)
MCH: 22.2 pg — ABNORMAL LOW (ref 26.0–34.0)
MCHC: 30.4 g/dL (ref 30.0–36.0)
MCV: 73.2 fL — ABNORMAL LOW (ref 80.0–100.0)
PLATELETS: 328 10*3/uL (ref 150–400)
RBC: 5.44 MIL/uL — ABNORMAL HIGH (ref 3.87–5.11)
RDW: 13.8 % (ref 11.5–15.5)
WBC: 7.7 10*3/uL (ref 4.0–10.5)
nRBC: 0 % (ref 0.0–0.2)

## 2018-03-28 LAB — BASIC METABOLIC PANEL
Anion gap: 7 (ref 5–15)
BUN: 14 mg/dL (ref 8–23)
CALCIUM: 8.9 mg/dL (ref 8.9–10.3)
CO2: 22 mmol/L (ref 22–32)
CREATININE: 0.79 mg/dL (ref 0.44–1.00)
Chloride: 105 mmol/L (ref 98–111)
Glucose, Bld: 171 mg/dL — ABNORMAL HIGH (ref 70–99)
Potassium: 3.7 mmol/L (ref 3.5–5.1)
SODIUM: 134 mmol/L — AB (ref 135–145)

## 2018-03-28 LAB — TROPONIN I

## 2018-03-28 LAB — MRSA PCR SCREENING: MRSA by PCR: NEGATIVE

## 2018-03-28 LAB — LIPASE, BLOOD: Lipase: 25 U/L (ref 11–51)

## 2018-03-28 MED ORDER — DIPHENHYDRAMINE HCL 50 MG/ML IJ SOLN: 12.5000 mg | Freq: Four times a day (QID) | INTRAMUSCULAR | Status: DC | PRN

## 2018-03-28 MED ORDER — ONDANSETRON 4 MG PO TBDP
4.0000 mg | ORAL_TABLET | Freq: Four times a day (QID) | ORAL | Status: DC | PRN
  Administered 2018-03-29: 4 mg via ORAL
  Filled 2018-03-28: qty 1

## 2018-03-28 MED ORDER — METOPROLOL TARTRATE 5 MG/5ML IV SOLN: 5.0000 mg | Freq: Four times a day (QID) | INTRAVENOUS | Status: DC | PRN

## 2018-03-28 MED ORDER — AMLODIPINE BESYLATE 5 MG PO TABS
5.0000 mg | ORAL_TABLET | Freq: Every day | ORAL | Status: DC
  Administered 2018-03-28 – 2018-03-30 (×2): 5 mg via ORAL
  Filled 2018-03-28 (×3): qty 1

## 2018-03-28 MED ORDER — IOPAMIDOL (ISOVUE-300) INJECTION 61%
100.0000 mL | Freq: Once | INTRAVENOUS | Status: AC | PRN
  Administered 2018-03-28: 100 mL via INTRAVENOUS

## 2018-03-28 MED ORDER — ENOXAPARIN SODIUM 40 MG/0.4ML ~~LOC~~ SOLN
40.0000 mg | SUBCUTANEOUS | Status: DC
  Administered 2018-03-28 – 2018-03-29 (×2): 40 mg via SUBCUTANEOUS
  Filled 2018-03-28 (×2): qty 0.4

## 2018-03-28 MED ORDER — SODIUM CHLORIDE 0.9 % IV SOLN
2.0000 g | INTRAVENOUS | Status: AC
  Administered 2018-03-29: 2 g via INTRAVENOUS
  Filled 2018-03-28 (×2): qty 2

## 2018-03-28 MED ORDER — OXYCODONE HCL 5 MG PO TABS
5.0000 mg | ORAL_TABLET | ORAL | Status: DC | PRN
  Administered 2018-03-29 – 2018-03-30 (×4): 10 mg via ORAL
  Filled 2018-03-28 (×4): qty 2

## 2018-03-28 MED ORDER — LACTATED RINGERS IV SOLN
INTRAVENOUS | Status: DC
  Administered 2018-03-28 – 2018-03-29 (×2): via INTRAVENOUS

## 2018-03-28 MED ORDER — ONDANSETRON HCL 4 MG/2ML IJ SOLN
4.0000 mg | Freq: Once | INTRAMUSCULAR | Status: AC
  Administered 2018-03-28: 4 mg via INTRAVENOUS
  Filled 2018-03-28: qty 2

## 2018-03-28 MED ORDER — FENTANYL CITRATE (PF) 100 MCG/2ML IJ SOLN
50.0000 ug | Freq: Once | INTRAMUSCULAR | Status: AC
  Administered 2018-03-28: 50 ug via INTRAVENOUS
  Filled 2018-03-28: qty 2

## 2018-03-28 MED ORDER — MORPHINE SULFATE (PF) 2 MG/ML IV SOLN
2.0000 mg | INTRAVENOUS | Status: DC | PRN
  Administered 2018-03-28 – 2018-03-29 (×5): 2 mg via INTRAVENOUS
  Filled 2018-03-28 (×5): qty 1

## 2018-03-28 MED ORDER — LIDOCAINE VISCOUS HCL 2 % MT SOLN
15.0000 mL | Freq: Once | OROMUCOSAL | Status: AC
  Administered 2018-03-28: 15 mL via ORAL
  Filled 2018-03-28: qty 15

## 2018-03-28 MED ORDER — ZOLPIDEM TARTRATE 5 MG PO TABS: 5.0000 mg | ORAL_TABLET | Freq: Every evening | ORAL | Status: DC | PRN

## 2018-03-28 MED ORDER — HYDROCHLOROTHIAZIDE 25 MG PO TABS
25.0000 mg | ORAL_TABLET | Freq: Every day | ORAL | Status: DC
  Administered 2018-03-28 – 2018-03-30 (×2): 25 mg via ORAL
  Filled 2018-03-28 (×3): qty 1

## 2018-03-28 MED ORDER — SIMETHICONE 80 MG PO CHEW: 40.0000 mg | CHEWABLE_TABLET | Freq: Four times a day (QID) | ORAL | Status: DC | PRN

## 2018-03-28 MED ORDER — PANTOPRAZOLE SODIUM 40 MG IV SOLR
40.0000 mg | Freq: Every day | INTRAVENOUS | Status: DC
  Administered 2018-03-28 – 2018-03-29 (×2): 40 mg via INTRAVENOUS
  Filled 2018-03-28 (×2): qty 40

## 2018-03-28 MED ORDER — ACETAMINOPHEN 500 MG PO TABS
1000.0000 mg | ORAL_TABLET | Freq: Four times a day (QID) | ORAL | Status: DC
  Administered 2018-03-28 – 2018-03-30 (×4): 1000 mg via ORAL
  Filled 2018-03-28 (×4): qty 2

## 2018-03-28 MED ORDER — ONDANSETRON HCL 4 MG/2ML IJ SOLN: 4.0000 mg | Freq: Four times a day (QID) | INTRAMUSCULAR | Status: DC | PRN

## 2018-03-28 MED ORDER — DIPHENHYDRAMINE HCL 12.5 MG/5ML PO ELIX: 12.5000 mg | ORAL_SOLUTION | Freq: Four times a day (QID) | ORAL | Status: DC | PRN

## 2018-03-28 MED ORDER — ALUM & MAG HYDROXIDE-SIMETH 200-200-20 MG/5ML PO SUSP
30.0000 mL | Freq: Once | ORAL | Status: AC
  Administered 2018-03-28: 30 mL via ORAL
  Filled 2018-03-28: qty 30

## 2018-03-28 NOTE — ED Provider Notes (Signed)
[facility] EMERGENCY DEPARTMENT Provider Note   CSN: [id number] Arrival date & time: 03/28/18  0732     History   Chief Complaint Chief Complaint  Patient presents with  . Chest Pain    HPI Selena Christian is a 67 y.o. female.  HPI Patient presents with nausea vomiting chest abdominal pain.  Began last night.  No diarrhea.  Pain is in her mid chest, not from her epigastric area.  Not vomiting any blood.  Recently started on naproxen for her left rotator cuff pain.  No heart history.  States she feels bad and pain is severe.  She has not eaten today.  No swelling in her legs. Past Medical History:  Diagnosis Date  . Diverticulitis   . Gallstones 02/09/2012  . Hypercholesterolemia    in past  . Hypertension     Patient Active Problem List   Diagnosis Date Noted  . Abdominal pain 03/28/2018  . Gallstones 02/09/2012  . Diverticulitis 02/07/2012  . Dehydration 02/07/2012  . Hypokalemia 02/07/2012  . Intractable nausea and vomiting 02/07/2012  . Hypertension 02/07/2012    Past Surgical History:  Procedure Laterality Date  . BUNIONECTOMY    . COLONOSCOPY     [place], remote past.   . COLONOSCOPY  03/02/2012   Procedure: COLONOSCOPY;  Surgeon: Danie Binder, MD;  [facility]: [facility];  Service: Endoscopy;  Laterality: N/A;  9:30  . TUBAL LIGATION       OB History   None      Home Medications    Prior to Admission medications   Medication Sig Start Date End Date Taking? Authorizing Provider  amLODipine (NORVASC) 5 MG tablet Take 5 mg by mouth daily.  03/22/18  Yes [provider]  atorvastatin (LIPITOR) 40 MG tablet Take 40 mg by mouth daily.   Yes [provider]  Calcium Carbonate Antacid (ALKA-SELTZER ANTACID PO) Take 1 tablet by mouth daily as needed.   Yes [provider]  hydrochlorothiazide (HYDRODIURIL) 25 MG tablet Take 25 mg by mouth daily.  03/22/18  Yes [provider]  naproxen (NAPROSYN) 375 MG tablet Take  375 mg by mouth 2 (two) times daily with a meal.  03/23/18  Yes [provider]    Family History Family History  Problem Relation Age of Onset  . Colon cancer Neg Hx     Social History Social History   Tobacco Use  . Smoking status: Former Research scientist (life sciences)  . Smokeless tobacco: Current User  Substance Use Topics  . Alcohol use: Yes    Comment: social  . Drug use: No     Allergies   Penicillins   Review of Systems Review of Systems  Constitutional: Positive for appetite change.  HENT: Negative for congestion.   Respiratory: Negative for shortness of breath.   Cardiovascular: Positive for chest pain.  Gastrointestinal: Positive for abdominal pain, nausea and vomiting.  Genitourinary: Negative for flank pain.  Musculoskeletal: Negative for back pain.  Skin: Negative for rash.  Neurological: Negative for weakness.  Psychiatric/Behavioral: Negative for confusion.     Physical Exam Updated Vital Signs BP 134/81   Pulse 69   Temp 98.1 F (36.7 C) (Oral)   Resp 13   Ht 5\' 2"  (1.575 m)   Wt 84.4 kg   SpO2 100%   BMI 34.02 kg/m   Physical Exam  Constitutional: She appears well-developed.  HENT:  Head: Normocephalic.  Neck: Normal range of motion.  Cardiovascular: Normal rate.  Pulmonary/Chest: She  has no wheezes. She has no rhonchi. She has no rales.  Abdominal:  Moderate epigastric tenderness without hernia.  Musculoskeletal:       Right lower leg: She exhibits no edema.       Left lower leg: She exhibits no edema.  Neurological: She is alert.  Skin: Capillary refill takes less than 2 seconds.     ED Treatments / Results  Labs (all labs ordered are listed, but only abnormal results are displayed) Labs Reviewed  BASIC METABOLIC PANEL - Abnormal; Notable for the following components:      Result Value   Sodium 134 (*)    Glucose, Bld 171 (*)    All other components within normal limits  CBC - Abnormal; Notable for the following components:   RBC  5.44 (*)    MCV 73.2 (*)    MCH 22.2 (*)    All other components within normal limits  HEPATIC FUNCTION PANEL - Abnormal; Notable for the following components:   Total Protein 8.4 (*)    All other components within normal limits  TROPONIN I  LIPASE, BLOOD  HIV ANTIBODY (ROUTINE TESTING W REFLEX)    EKG EKG Interpretation  Date/Time:  Tuesday March 28 2018 07:48:10 EST Ventricular Rate:  82 PR Interval:    QRS Duration: 91 QT Interval:  399 QTC Calculation: 466 R Axis:   41 Text Interpretation:  Sinus rhythm Confirmed by Davonna Belling [phone]) on 03/28/2018 7:50:09 AM   Radiology Dg Chest 2 View  Result Date: 03/28/2018 CLINICAL DATA:  Chest pain EXAM: CHEST - 2 VIEW COMPARISON:  None. FINDINGS: Heart and mediastinal contours are within normal limits. No focal opacities or effusions. No acute bony abnormality. IMPRESSION: No active cardiopulmonary disease. Electronically Signed   By: Rolm Baptise M.D.   On: 03/28/2018 08:33   US Pelvis (transabdominal Only)  Result Date: 03/28/2018 CLINICAL DATA:  Enlarged endometrium on CT. EXAM: TRANSABDOMINAL AND TRANSVAGINAL ULTRASOUND OF PELVIS TECHNIQUE: Both transabdominal and transvaginal ultrasound examinations of the pelvis were performed. Transabdominal technique was performed for global imaging of the pelvis including uterus, ovaries, adnexal regions, and pelvic cul-de-sac. It was necessary to proceed with endovaginal exam following the transabdominal exam to visualize the uterus and ovaries. COMPARISON:  CT 03/28/2018. FINDINGS: Uterus Measurements: 7.3 x 3.0 x 3.7 cm = volume: 41.6 mL. No fibroids or other mass visualized. Endometrium Thickness: 10.9 mm.  Fluid noted in the endometrial canal. Right ovary Not visualized Left ovary Not visualized Other findings No abnormal free fluid. IMPRESSION: 1. Endometrial thickening. Endometrial thickness is considered abnormal for an asymptomatic post-menopausal female. Endometrial sampling  should be considered to exclude carcinoma. 2.  Fluid noted in the endometrial canal. Electronically Signed   By: Marcello Moores  Register   On: 03/28/2018 13:18   Ct Abdomen Pelvis W Contrast  Result Date: 03/28/2018 CLINICAL DATA:  Acute abdominal pain. Nausea. Vomiting. EXAM: CT ABDOMEN AND PELVIS WITH CONTRAST TECHNIQUE: Multidetector CT imaging of the abdomen and pelvis was performed using the standard protocol following bolus administration of intravenous contrast. CONTRAST:  139mL ISOVUE-300 IOPAMIDOL (ISOVUE-300) INJECTION 61% COMPARISON:  CT scan dated 02/06/2012 FINDINGS: Lower chest: Aortic atherosclerosis. Otherwise negative. Hepatobiliary: Slight diffuse hepatic steatosis. Multiple gallstones. Slight thickening of the gallbladder wall. No dilated bile ducts. Pancreas: Unremarkable. No pancreatic ductal dilatation or surrounding inflammatory changes. Spleen: Normal in size without focal abnormality. Adrenals/Urinary Tract: Normal adrenal glands. Tiny cysts in the upper pole and midportion of the left kidney. Tiny calcification in the mid  left kidney. No hydronephrosis. Bladder is normal. Stomach/Bowel: There's extensive diverticulosis of the colon. No diverticulitis. Appendix is normal. Stomach and small bowel appear normal. Vascular/Lymphatic: Aortic atherosclerosis. No enlarged abdominal or pelvic lymph nodes. Reproductive: Ovaries appear normal. Prominence of the endometrial cavity for a patient of this age. Other: No abdominal wall hernia or abnormality. No abdominopelvic ascites. Musculoskeletal: No acute abnormalities. Minimal degenerative changes of both hips. IMPRESSION: 1. No acute abnormalities of the abdomen or pelvis. 2. Extensive diverticulosis of the colon. 3. Cholelithiasis. 4. Hepatic steatosis. 5. Prominence of the endometrial cavity. This could be due to cervical stenosis. Pelvic ultrasound recommended for evaluation of endometrial thickness. Aortic Atherosclerosis (ICD10-I70.0).  Electronically Signed   By: Lorriane Shire M.D.   On: 03/28/2018 10:47   US Abdomen Limited Ruq  Result Date: 03/28/2018 CLINICAL DATA:  Abdominal pain. EXAM: ULTRASOUND ABDOMEN LIMITED RIGHT UPPER QUADRANT COMPARISON:  CT 03/28/2018. FINDINGS: Gallbladder: Gallstones noted, the largest measures 2 cm. Gallbladder wall thickness 2.4 mm. Negative Murphy sign. Common bile duct: Diameter: 3.6 mm Liver: Increased echogenicity consistent fatty infiltration. An area of decreased density centrally is noted, this is most likely focal fatty sparing. Portal vein is patent on color Doppler imaging with normal direction of blood flow towards the liver. IMPRESSION: 1. Gallstones, the largest measures 2 cm. No evidence of cholecystitis or biliary distention. 2.  Fatty infiltration liver with focal fatty sparing. Electronically Signed   By: Marcello Moores  Register   On: 03/28/2018 13:08    Procedures Procedures (including critical care time)  Medications Ordered in ED Medications  morphine 2 MG/ML injection 2 mg (2 mg Intravenous Given 03/28/18 1451)  amLODipine (NORVASC) tablet 5 mg (has no administration in time range)  hydrochlorothiazide (HYDRODIURIL) tablet 25 mg (has no administration in time range)  enoxaparin (LOVENOX) injection 40 mg (has no administration in time range)  lactated ringers infusion (has no administration in time range)  simethicone (MYLICON) chewable tablet 40 mg (has no administration in time range)  pantoprazole (PROTONIX) injection 40 mg (has no administration in time range)  metoprolol tartrate (LOPRESSOR) injection 5 mg (has no administration in time range)  ondansetron (ZOFRAN-ODT) disintegrating tablet 4 mg (has no administration in time range)    Or  ondansetron (ZOFRAN) injection 4 mg (has no administration in time range)  diphenhydrAMINE (BENADRYL) 12.5 MG/5ML elixir 12.5 mg (has no administration in time range)    Or  diphenhydrAMINE (BENADRYL) injection 12.5 mg (has no  administration in time range)  zolpidem (AMBIEN) tablet 5 mg (has no administration in time range)  oxyCODONE (Oxy IR/ROXICODONE) immediate release tablet 5-10 mg (has no administration in time range)  acetaminophen (TYLENOL) tablet 1,000 mg (has no administration in time range)  cefoTEtan (CEFOTAN) 2 g in sodium chloride 0.9 % 100 mL IVPB (has no administration in time range)  alum & mag hydroxide-simeth (MAALOX/MYLANTA) 200-200-20 MG/5ML suspension 30 mL (30 mLs Oral Given 03/28/18 0830)    And  lidocaine (XYLOCAINE) 2 % viscous mouth solution 15 mL (15 mLs Oral Given 03/28/18 0830)  ondansetron (ZOFRAN) injection 4 mg (4 mg Intravenous Given 03/28/18 0852)  fentaNYL (SUBLIMAZE) injection 50 mcg (50 mcg Intravenous Given 03/28/18 1015)  iopamidol (ISOVUE-300) 61 % injection 100 mL (100 mLs Intravenous Contrast Given 03/28/18 1024)  fentaNYL (SUBLIMAZE) injection 50 mcg (50 mcg Intravenous Given 03/28/18 1141)  fentaNYL (SUBLIMAZE) injection 50 mcg (50 mcg Intravenous Given 03/28/18 1312)     Initial Impression / Assessment and Plan / ED Course  I have  reviewed the triage vital signs and the nursing notes.  Pertinent labs & imaging results that were available during my care of the patient were reviewed by me and considered in my medical decision making (see chart for details).     Patient with nausea vomiting chest pain abdominal pain.  Lab work overall reassuring but continued pain.  CT scan showed gallstones.  Ultrasound also showed gallstones without cholecystitis.  Continued pain.  Will admit to general surgery.  Final Clinical Impressions(s) / ED Diagnoses   Final diagnoses:  Abdominal pain  Biliary colic    ED Discharge Orders    None       Davonna Belling, MD 03/28/18 1504

## 2018-03-28 NOTE — ED Notes (Signed)
Pt to xray

## 2018-03-28 NOTE — H&P (Signed)
[facility] History and Physical  Reason for Referral: Gallstone, intractable nausea/vomiting and pain  Referring Physician:  Dr. Toni Arthurs Selena Christian is a 67 y.o. female.  HPI: Selena Christian is a 67 yo with intractable abdominal pain and nausea/vomiting who was found to have gallstones on CT scan was underwent a [place] to verify that she did not have cholecystitis given her pain.  She says that the pain started last night about 1230 am and has persisted. She has only gotten a little bit of relief with the pain medication. The pain is epigastric/RUQ in nature and is constant/ sharp. She denies any prior history of RUQ pain with greasy food. She says she has vomited a few times. Currently she is still in pain.   Past Medical History:  Diagnosis Date  . Diverticulitis   . Gallstones 02/09/2012  . Hypercholesterolemia    in past  . Hypertension     Past Surgical History:  Procedure Laterality Date  . BUNIONECTOMY    . COLONOSCOPY     [place], remote past.   . COLONOSCOPY  03/02/2012   Procedure: COLONOSCOPY;  Surgeon: Danie Binder, MD;  Location: [facility];  Service: Endoscopy;  Laterality: N/A;  9:30  . TUBAL LIGATION      Family History  Problem Relation Age of Onset  . Colon cancer Neg Hx     Social History   Tobacco Use  . Smoking status: Former Research scientist (life sciences)  . Smokeless tobacco: Current User  Substance Use Topics  . Alcohol use: Yes    Comment: social  . Drug use: No    Medications:  I have reviewed the patient's current medications. Prior to Admission:  (Not in a hospital admission) Scheduled: . acetaminophen  1,000 mg Oral Q6H  . amLODipine  5 mg Oral Daily  . enoxaparin (LOVENOX) injection  40 mg Subcutaneous Q24H  . hydrochlorothiazide  25 mg Oral Daily  . pantoprazole (PROTONIX) IV  40 mg Intravenous QHS   Continuous: . [START ON 03/29/2018] cefoTEtan (CEFOTAN) IV    . lactated ringers     NTZ:[id number] **OR**  diphenhydrAMINE, metoprolol tartrate, morphine injection, ondansetron **OR** ondansetron (ZOFRAN) IV, oxyCODONE, simethicone, zolpidem  Allergies  Allergen Reactions  . Penicillins Other (See Comments)    Child hood allergy    ROS:  A comprehensive review of systems was negative except for: Gastrointestinal: positive for abdominal pain, nausea and vomiting  Blood pressure 134/81, pulse 69, temperature 98.1 F (36.7 C), temperature source Oral, resp. rate 13, height 5\' 2"  (1.575 m), weight 84.4 kg, SpO2 100 %. Physical Exam  Constitutional: She is oriented to person, place, and time. She appears well-developed and well-nourished.  HENT:  Head: Normocephalic and atraumatic.  Neck: Normal range of motion.  Cardiovascular: Normal rate and regular rhythm.  Pulmonary/Chest: Effort normal.  Abdominal: Soft. She exhibits no distension. There is tenderness in the right upper quadrant and epigastric area. There is no rigidity, no rebound and no guarding. Hernia confirmed negative in the ventral area.  Musculoskeletal: Normal range of motion. She exhibits no edema.  Neurological: She is alert and oriented to person, place, and time.  Skin: Skin is warm and dry.  Psychiatric: She has a normal mood and affect. Her behavior is normal. Judgment and thought content normal.  Vitals reviewed.   Results: Results for orders placed or performed during the hospital encounter of 03/28/18 (from the past 48 hour(s))  Basic metabolic panel  Status: Abnormal   Collection Time: 03/28/18  7:50 AM  Result Value Ref Range   Sodium 134 (L) 135 - 145 mmol/L   Potassium 3.7 3.5 - 5.1 mmol/L   Chloride 105 98 - 111 mmol/L   CO2 22 22 - 32 mmol/L   Glucose, Bld 171 (H) 70 - 99 mg/dL   BUN 14 8 - 23 mg/dL   Creatinine, Ser 0.79 0.44 - 1.00 mg/dL   Calcium 8.9 8.9 - 10.3 mg/dL   GFR calc non Af Amer >60 >60 mL/min   GFR calc Af Amer >60 >60 mL/min   Anion gap 7 5 - 15    Comment: Performed at [facility], [address], [place], [place] [address]  CBC     Status: Abnormal   Collection Time: 03/28/18  7:50 AM  Result Value Ref Range   WBC 7.7 4.0 - 10.5 K/uL   RBC 5.44 (H) 3.87 - 5.11 MIL/uL   Hemoglobin 12.1 12.0 - 15.0 g/dL   HCT 39.8 36.0 - 46.0 %   MCV 73.2 (L) 80.0 - 100.0 fL   MCH 22.2 (L) 26.0 - 34.0 pg   MCHC 30.4 30.0 - 36.0 g/dL   RDW 13.8 11.5 - 15.5 %   Platelets 328 150 - 400 K/uL   nRBC 0.0 0.0 - 0.2 %    Comment: Performed at [facility], [address] [place], [place] [address]  Troponin I - ONCE - STAT     Status: None   Collection Time: 03/28/18  7:50 AM  Result Value Ref Range   Troponin I <0.03 <0.03 ng/mL    Comment: Performed at [facility], [address], [place], [place] [address]  Hepatic function panel     Status: Abnormal   Collection Time: 03/28/18  7:50 AM  Result Value Ref Range   Total Protein 8.4 (H) 6.5 - 8.1 g/dL   Albumin 4.7 3.5 - 5.0 g/dL   AST 17 15 - 41 U/L   ALT 17 0 - 44 U/L   Alkaline Phosphatase 71 38 - 126 U/L   Total Bilirubin 0.5 0.3 - 1.2 mg/dL   Bilirubin, Direct <0.1 0.0 - 0.2 mg/dL   Indirect Bilirubin NOT CALCULATED 0.3 - 0.9 mg/dL    Comment: Performed at [facility], [address] [place], [place] [address]  Lipase, blood     Status: None   Collection Time: 03/28/18  7:50 AM  Result Value Ref Range   Lipase 25 11 - 51 U/L    Comment: Performed at [facility], [address] [place],  [address]   Personally reviewed- CT- gallbladder dilated with large stone in the infundibulum, no real thickening seen, CXR wnl, [place]- CBD 3.6, no thickening of the gallbladder wall, stones   Dg Chest 2 View  Result Date: 03/28/2018 CLINICAL DATA:  Chest pain EXAM: CHEST - 2 VIEW COMPARISON:  None. FINDINGS: Heart and mediastinal contours are within normal limits. No focal opacities or effusions. No acute bony abnormality. IMPRESSION: No active cardiopulmonary disease. Electronically Signed   By: Rolm Baptise M.D.   On: 03/28/2018  08:33   US Pelvis (transabdominal Only)  Result Date: 03/28/2018 CLINICAL DATA:  Enlarged endometrium on CT. EXAM: TRANSABDOMINAL AND TRANSVAGINAL ULTRASOUND OF PELVIS TECHNIQUE: Both transabdominal and transvaginal ultrasound examinations of the pelvis were performed. Transabdominal technique was performed for global imaging of the pelvis including uterus, ovaries, adnexal regions, and pelvic cul-de-sac. It was necessary to proceed with endovaginal exam following  the transabdominal exam to visualize the uterus and ovaries. COMPARISON:  CT 03/28/2018. FINDINGS: Uterus Measurements: 7.3 x 3.0 x 3.7 cm = volume: 41.6 mL. No fibroids or other mass visualized. Endometrium Thickness: 10.9 mm.  Fluid noted in the endometrial canal. Right ovary Not visualized Left ovary Not visualized Other findings No abnormal free fluid. IMPRESSION: 1. Endometrial thickening. Endometrial thickness is considered abnormal for an asymptomatic post-menopausal female. Endometrial sampling should be considered to exclude carcinoma. 2.  Fluid noted in the endometrial canal. Electronically Signed   By: Marcello Moores  Register   On: 03/28/2018 13:18   Ct Abdomen Pelvis W Contrast  Result Date: 03/28/2018 CLINICAL DATA:  Acute abdominal pain. Nausea. Vomiting. EXAM: CT ABDOMEN AND PELVIS WITH CONTRAST TECHNIQUE: Multidetector CT imaging of the abdomen and pelvis was performed using the standard protocol following bolus administration of intravenous contrast. CONTRAST:  128mL ISOVUE-300 IOPAMIDOL (ISOVUE-300) INJECTION 61% COMPARISON:  CT scan dated 02/06/2012 FINDINGS: Lower chest: Aortic atherosclerosis. Otherwise negative. Hepatobiliary: Slight diffuse hepatic steatosis. Multiple gallstones. Slight thickening of the gallbladder wall. No dilated bile ducts. Pancreas: Unremarkable. No pancreatic ductal dilatation or surrounding inflammatory changes. Spleen: Normal in size without focal abnormality. Adrenals/Urinary Tract: Normal adrenal glands.  Tiny cysts in the upper pole and midportion of the left kidney. Tiny calcification in the mid left kidney. No hydronephrosis. Bladder is normal. Stomach/Bowel: There's extensive diverticulosis of the colon. No diverticulitis. Appendix is normal. Stomach and small bowel appear normal. Vascular/Lymphatic: Aortic atherosclerosis. No enlarged abdominal or pelvic lymph nodes. Reproductive: Ovaries appear normal. Prominence of the endometrial cavity for a patient of this age. Other: No abdominal wall hernia or abnormality. No abdominopelvic ascites. Musculoskeletal: No acute abnormalities. Minimal degenerative changes of both hips. IMPRESSION: 1. No acute abnormalities of the abdomen or pelvis. 2. Extensive diverticulosis of the colon. 3. Cholelithiasis. 4. Hepatic steatosis. 5. Prominence of the endometrial cavity. This could be due to cervical stenosis. Pelvic ultrasound recommended for evaluation of endometrial thickness. Aortic Atherosclerosis (ICD10-I70.0). Electronically Signed   By: Lorriane Shire M.D.   On: 03/28/2018 10:47   US Abdomen Limited Ruq  Result Date: 03/28/2018 CLINICAL DATA:  Abdominal pain. EXAM: ULTRASOUND ABDOMEN LIMITED RIGHT UPPER QUADRANT COMPARISON:  CT 03/28/2018. FINDINGS: Gallbladder: Gallstones noted, the largest measures 2 cm. Gallbladder wall thickness 2.4 mm. Negative Murphy sign. Common bile duct: Diameter: 3.6 mm Liver: Increased echogenicity consistent fatty infiltration. An area of decreased density centrally is noted, this is most likely focal fatty sparing. Portal vein is patent on color Doppler imaging with normal direction of blood flow towards the liver. IMPRESSION: 1. Gallstones, the largest measures 2 cm. No evidence of cholecystitis or biliary distention. 2.  Fatty infiltration liver with focal fatty sparing. Electronically Signed   By: Marcello Moores  Register   On: 03/28/2018 13:08     Assessment & Plan:  DANNYA PITKIN is a 67 y.o. female with cholelithiasis and  intractable pain and nausea/vomiting.She does not have signs of cholecystitis on the [place] or CT but given her pain it is likely she will have some degree of cholecystitis.    -Admit with pain meds  -Home BP meds ordered, PRN ordered, BP improved with pain control -NPO except ice and sip with meds -Repeat labs in the AM  -OR for lap chole in the AM -IVF -SCDs, lovenox   -PLAN: I counseled the patient about the indication, risks and benefits of laparoscopic cholecystectomy.  She understands there is a very small chance for bleeding, infection, injury to normal  structures (including common bile duct), conversion to open surgery, persistent symptoms, evolution of postcholecystectomy diarrhea, need for secondary interventions, anesthesia reaction, cardiopulmonary issues and other risks not specifically detailed here. I described the expected recovery, the plan for follow-up and the restrictions during the recovery phase.  All questions were answered.  All questions were answered to the satisfaction of the patient and family.   Virl Cagey 03/28/2018, 2:59 PM

## 2018-03-28 NOTE — ED Triage Notes (Signed)
Patient complains of chest pain and pressure with nausea. Patient state she has vomited 3 times this morning since 1230. Denies diarrhea.

## 2018-03-29 ENCOUNTER — Observation Stay (HOSPITAL_COMMUNITY): Payer: Medicare Other | Admitting: Anesthesiology

## 2018-03-29 ENCOUNTER — Encounter (HOSPITAL_COMMUNITY): Payer: Self-pay | Admitting: *Deleted

## 2018-03-29 ENCOUNTER — Encounter (HOSPITAL_COMMUNITY)
Admission: EM | Disposition: A | Discharge status: Home / Self Care | Payer: Self-pay | Source: Home / Self Care | Attending: Emergency Medicine

## 2018-03-29 DIAGNOSIS — I10 Essential (primary) hypertension: Secondary | ICD-10-CM | POA: Diagnosis not present

## 2018-03-29 DIAGNOSIS — Z87891 Personal history of nicotine dependence: Secondary | ICD-10-CM | POA: Diagnosis not present

## 2018-03-29 DIAGNOSIS — K8020 Calculus of gallbladder without cholecystitis without obstruction: Secondary | ICD-10-CM | POA: Diagnosis not present

## 2018-03-29 DIAGNOSIS — K8066 Calculus of gallbladder and bile duct with acute and chronic cholecystitis without obstruction: Secondary | ICD-10-CM | POA: Diagnosis not present

## 2018-03-29 DIAGNOSIS — Z880 Allergy status to penicillin: Secondary | ICD-10-CM | POA: Diagnosis not present

## 2018-03-29 DIAGNOSIS — Z79899 Other long term (current) drug therapy: Secondary | ICD-10-CM | POA: Diagnosis not present

## 2018-03-29 DIAGNOSIS — K8012 Calculus of gallbladder with acute and chronic cholecystitis without obstruction: Secondary | ICD-10-CM | POA: Diagnosis not present

## 2018-03-29 DIAGNOSIS — K8000 Calculus of gallbladder with acute cholecystitis without obstruction: Secondary | ICD-10-CM | POA: Diagnosis not present

## 2018-03-29 DIAGNOSIS — K810 Acute cholecystitis: Secondary | ICD-10-CM | POA: Diagnosis not present

## 2018-03-29 HISTORY — PX: CHOLECYSTECTOMY: SHX55

## 2018-03-29 LAB — COMPREHENSIVE METABOLIC PANEL
ALBUMIN: 3.8 g/dL (ref 3.5–5.0)
ALT: 26 U/L (ref 0–44)
AST: 28 U/L (ref 15–41)
Alkaline Phosphatase: 61 U/L (ref 38–126)
Anion gap: 7 (ref 5–15)
BUN: 13 mg/dL (ref 8–23)
CO2: 24 mmol/L (ref 22–32)
Calcium: 8.6 mg/dL — ABNORMAL LOW (ref 8.9–10.3)
Chloride: 101 mmol/L (ref 98–111)
Creatinine, Ser: 0.92 mg/dL (ref 0.44–1.00)
GFR calc non Af Amer: >60 mL/min (ref >60)
Glucose, Bld: 118 mg/dL — ABNORMAL HIGH (ref 70–99)
Potassium: 3.4 mmol/L — ABNORMAL LOW (ref 3.5–5.1)
Sodium: 132 mmol/L — ABNORMAL LOW (ref 135–145)
Total Bilirubin: 0.9 mg/dL (ref 0.3–1.2)
Total Protein: 7.1 g/dL (ref 6.5–8.1)

## 2018-03-29 LAB — CBC WITH DIFFERENTIAL/PLATELET
Abs Immature Granulocytes: 0.03 10*3/uL (ref 0.00–0.07)
Basophils Absolute: 0 10*3/uL (ref 0.0–0.1)
Basophils Relative: 0 %
EOS PCT: 2 %
Eosinophils Absolute: 0.1 10*3/uL (ref 0.0–0.5)
HCT: 37.6 % (ref 36.0–46.0)
Hemoglobin: 11.6 g/dL — ABNORMAL LOW (ref 12.0–15.0)
Immature Granulocytes: 0 %
Lymphocytes Relative: 27 %
Lymphs Abs: 2.5 10*3/uL (ref 0.7–4.0)
MCH: 22.7 pg — AB (ref 26.0–34.0)
MCHC: 30.9 g/dL (ref 30.0–36.0)
MCV: 73.6 fL — ABNORMAL LOW (ref 80.0–100.0)
Monocytes Absolute: 1 10*3/uL (ref 0.1–1.0)
Monocytes Relative: 11 %
Neutro Abs: 5.4 10*3/uL (ref 1.7–7.7)
Neutrophils Relative %: 60 %
Platelets: 313 10*3/uL (ref 150–400)
RBC: 5.11 MIL/uL (ref 3.87–5.11)
RDW: 13.7 % (ref 11.5–15.5)
WBC: 9.1 10*3/uL (ref 4.0–10.5)
nRBC: 0 % (ref 0.0–0.2)

## 2018-03-29 LAB — HIV ANTIBODY (ROUTINE TESTING W REFLEX): HIV Screen 4th Generation wRfx: NONREACTIVE

## 2018-03-29 SURGERY — LAPAROSCOPIC CHOLECYSTECTOMY
Anesthesia: General | Site: Abdomen

## 2018-03-29 MED ORDER — SUCCINYLCHOLINE CHLORIDE 200 MG/10ML IV SOSY
PREFILLED_SYRINGE | INTRAVENOUS | Status: AC
  Filled 2018-03-29: qty 10

## 2018-03-29 MED ORDER — FENTANYL CITRATE (PF) 100 MCG/2ML IJ SOLN
INTRAMUSCULAR | Status: DC | PRN
  Administered 2018-03-29 (×3): 50 ug via INTRAVENOUS

## 2018-03-29 MED ORDER — ROCURONIUM BROMIDE 10 MG/ML (PF) SYRINGE
PREFILLED_SYRINGE | INTRAVENOUS | Status: AC
  Filled 2018-03-29: qty 10

## 2018-03-29 MED ORDER — PROPOFOL 10 MG/ML IV BOLUS
INTRAVENOUS | Status: DC | PRN
  Administered 2018-03-29: 150 mg via INTRAVENOUS

## 2018-03-29 MED ORDER — EPHEDRINE 5 MG/ML INJ
INTRAVENOUS | Status: AC
  Filled 2018-03-29: qty 10

## 2018-03-29 MED ORDER — MIDAZOLAM HCL 5 MG/5ML IJ SOLN
INTRAMUSCULAR | Status: DC | PRN
  Administered 2018-03-29: 2 mg via INTRAVENOUS

## 2018-03-29 MED ORDER — FENTANYL CITRATE (PF) 250 MCG/5ML IJ SOLN
INTRAMUSCULAR | Status: AC
  Filled 2018-03-29: qty 5

## 2018-03-29 MED ORDER — BUPIVACAINE HCL (PF) 0.5 % IJ SOLN
INTRAMUSCULAR | Status: DC | PRN
  Administered 2018-03-29: 16 mL

## 2018-03-29 MED ORDER — PHENYLEPHRINE 40 MCG/ML (10ML) SYRINGE FOR IV PUSH (FOR BLOOD PRESSURE SUPPORT)
PREFILLED_SYRINGE | INTRAVENOUS | Status: AC
  Filled 2018-03-29: qty 10

## 2018-03-29 MED ORDER — DOCUSATE SODIUM 100 MG PO CAPS: 100.0000 mg | ORAL_CAPSULE | Freq: Two times a day (BID) | ORAL | 2 refills | Status: DC

## 2018-03-29 MED ORDER — SUGAMMADEX SODIUM 500 MG/5ML IV SOLN
INTRAVENOUS | Status: DC | PRN
  Administered 2018-03-29: 200 mg via INTRAVENOUS

## 2018-03-29 MED ORDER — OXYCODONE HCL 5 MG PO TABS: 5.0000 mg | ORAL_TABLET | ORAL | 0 refills | Status: DC | PRN

## 2018-03-29 MED ORDER — SUCCINYLCHOLINE CHLORIDE 20 MG/ML IJ SOLN
INTRAMUSCULAR | Status: DC | PRN
  Administered 2018-03-29: 160 mg via INTRAVENOUS

## 2018-03-29 MED ORDER — HYDROCODONE-ACETAMINOPHEN 7.5-325 MG PO TABS: 1.0000 | ORAL_TABLET | Freq: Once | ORAL | Status: DC | PRN

## 2018-03-29 MED ORDER — BUPIVACAINE HCL (PF) 0.5 % IJ SOLN
INTRAMUSCULAR | Status: AC
  Filled 2018-03-29: qty 30

## 2018-03-29 MED ORDER — EPHEDRINE SULFATE 50 MG/ML IJ SOLN
INTRAMUSCULAR | Status: DC | PRN
  Administered 2018-03-29: 10 mg via INTRAVENOUS

## 2018-03-29 MED ORDER — LACTATED RINGERS IV SOLN
INTRAVENOUS | Status: DC
  Administered 2018-03-29: 1000 mL via INTRAVENOUS

## 2018-03-29 MED ORDER — ONDANSETRON HCL 4 MG/2ML IJ SOLN
INTRAMUSCULAR | Status: DC | PRN
  Administered 2018-03-29: 4 mg via INTRAVENOUS

## 2018-03-29 MED ORDER — HYDROMORPHONE HCL 1 MG/ML IJ SOLN: 0.2500 mg | INTRAMUSCULAR | Status: DC | PRN

## 2018-03-29 MED ORDER — MIDAZOLAM HCL 2 MG/2ML IJ SOLN
INTRAMUSCULAR | Status: AC
  Filled 2018-03-29: qty 2

## 2018-03-29 MED ORDER — SUGAMMADEX SODIUM 200 MG/2ML IV SOLN
INTRAVENOUS | Status: AC
  Filled 2018-03-29: qty 2

## 2018-03-29 MED ORDER — PROMETHAZINE HCL 25 MG/ML IJ SOLN: 6.2500 mg | INTRAMUSCULAR | Status: DC | PRN

## 2018-03-29 MED ORDER — PHENYLEPHRINE HCL 10 MG/ML IJ SOLN
INTRAMUSCULAR | Status: DC | PRN
  Administered 2018-03-29: 40 ug via INTRAVENOUS

## 2018-03-29 MED ORDER — ONDANSETRON HCL 4 MG/2ML IJ SOLN
INTRAMUSCULAR | Status: AC
  Filled 2018-03-29: qty 2

## 2018-03-29 MED ORDER — ONDANSETRON 4 MG PO TBDP: 4.0000 mg | ORAL_TABLET | Freq: Four times a day (QID) | ORAL | 0 refills | Status: DC | PRN

## 2018-03-29 MED ORDER — SODIUM CHLORIDE 0.9 % IR SOLN
Status: DC | PRN
  Administered 2018-03-29: 1000 mL

## 2018-03-29 MED ORDER — HEMOSTATIC AGENTS (NO CHARGE) OPTIME
TOPICAL | Status: DC | PRN
  Administered 2018-03-29: 1 via TOPICAL

## 2018-03-29 MED ORDER — MIDAZOLAM HCL 2 MG/2ML IJ SOLN: 0.5000 mg | Freq: Once | INTRAMUSCULAR | Status: DC | PRN

## 2018-03-29 MED ORDER — ROCURONIUM BROMIDE 100 MG/10ML IV SOLN
INTRAVENOUS | Status: DC | PRN
  Administered 2018-03-29: 25 mg via INTRAVENOUS

## 2018-03-29 SURGICAL SUPPLY — 53 items
ADH SKN CLS APL DERMABOND .7 (GAUZE/BANDAGES/DRESSINGS) ×1
APL SRG 38 LTWT LNG FL B (MISCELLANEOUS) ×1
APPLICATOR ARISTA FLEXITIP XL (MISCELLANEOUS) ×2 IMPLANT
APPLIER CLIP ROT 10 11.4 M/L (STAPLE) ×3
APR CLP MED LRG 11.4X10 (STAPLE) ×1
BAG RETRIEVAL 10 (BASKET) ×1
BAG RETRIEVAL 10MM (BASKET) ×1
BLADE SURG 15 STRL LF DISP TIS (BLADE) ×1 IMPLANT
BLADE SURG 15 STRL SS (BLADE) ×3
CHLORAPREP W/TINT 26ML (MISCELLANEOUS) ×3 IMPLANT
CLIP APPLIE ROT 10 11.4 M/L (STAPLE) ×1 IMPLANT
CLOTH BEACON ORANGE TIMEOUT ST (SAFETY) ×3 IMPLANT
COVER LIGHT HANDLE STERIS (MISCELLANEOUS) ×6 IMPLANT
COVER WAND RF STERILE (DRAPES) ×3 IMPLANT
DECANTER SPIKE VIAL GLASS SM (MISCELLANEOUS) ×3 IMPLANT
DERMABOND ADVANCED (GAUZE/BANDAGES/DRESSINGS) ×2
DERMABOND ADVANCED .7 DNX12 (GAUZE/BANDAGES/DRESSINGS) ×1 IMPLANT
ELECT REM PT RETURN 9FT ADLT (ELECTROSURGICAL) ×3
ELECTRODE REM PT RTRN 9FT ADLT (ELECTROSURGICAL) ×1 IMPLANT
FILTER SMOKE EVAC LAPAROSHD (FILTER) ×3 IMPLANT
GLOVE BIO SURGEON STRL SZ 6.5 (GLOVE) ×2 IMPLANT
GLOVE BIO SURGEONS STRL SZ 6.5 (GLOVE) ×1
GLOVE BIOGEL PI IND STRL 6.5 (GLOVE) ×1 IMPLANT
GLOVE BIOGEL PI IND STRL 7.0 (GLOVE) ×3 IMPLANT
GLOVE BIOGEL PI INDICATOR 6.5 (GLOVE) ×2
GLOVE BIOGEL PI INDICATOR 7.0 (GLOVE) ×6
GLOVE ECLIPSE 6.5 STRL STRAW (GLOVE) ×6 IMPLANT
GOWN STRL REUS W/TWL LRG LVL3 (GOWN DISPOSABLE) ×9 IMPLANT
HEMOSTAT ARISTA ABSORB 3G PWDR (MISCELLANEOUS) ×3 IMPLANT
HEMOSTAT SNOW SURGICEL 2X4 (HEMOSTASIS) ×3 IMPLANT
INST SET LAPROSCOPIC AP (KITS) ×3 IMPLANT
IV NS IRRIG 3000ML ARTHROMATIC (IV SOLUTION) IMPLANT
KIT TURNOVER KIT A (KITS) ×3 IMPLANT
MANIFOLD NEPTUNE II (INSTRUMENTS) ×3 IMPLANT
NDL INSUFFLATION 14GA 120MM (NEEDLE) ×1 IMPLANT
NEEDLE INSUFFLATION 14GA 120MM (NEEDLE) ×3 IMPLANT
NS IRRIG 1000ML POUR BTL (IV SOLUTION) ×3 IMPLANT
PACK LAP CHOLE LZT030E (CUSTOM PROCEDURE TRAY) ×3 IMPLANT
PAD ARMBOARD 7.5X6 YLW CONV (MISCELLANEOUS) ×3 IMPLANT
SET BASIN LINEN APH (SET/KITS/TRAYS/PACK) ×3 IMPLANT
SET TUBE IRRIG SUCTION NO TIP (IRRIGATION / IRRIGATOR) IMPLANT
SLEEVE ENDOPATH XCEL 5M (ENDOMECHANICALS) ×3 IMPLANT
SUT MNCRL AB 4-0 PS2 18 (SUTURE) ×3 IMPLANT
SUT VICRYL 0 UR6 27IN ABS (SUTURE) ×3 IMPLANT
SYS BAG RETRIEVAL 10MM (BASKET) ×1
SYSTEM BAG RETRIEVAL 10MM (BASKET) ×1 IMPLANT
TROCAR ENDO BLADELESS 11MM (ENDOMECHANICALS) ×3 IMPLANT
TROCAR XCEL NON-BLD 5MMX100MML (ENDOMECHANICALS) ×3 IMPLANT
TROCAR XCEL UNIV SLVE 11M 100M (ENDOMECHANICALS) ×3 IMPLANT
TUBE CONNECTING 12'X1/4 (SUCTIONS) ×1
TUBE CONNECTING 12X1/4 (SUCTIONS) ×2 IMPLANT
TUBING INSUFFLATION (TUBING) ×3 IMPLANT
WARMER LAPAROSCOPE (MISCELLANEOUS) ×3 IMPLANT

## 2018-03-29 NOTE — Anesthesia Procedure Notes (Signed)
Procedure Name: Intubation Date/Time: 03/29/2018 10:30 AM Performed by: Charmaine Downs, CRNA Pre-anesthesia Checklist: Patient identified, Emergency Drugs available, Patient being monitored, Timeout performed and Suction available Patient Re-evaluated:Patient Re-evaluated prior to induction Oxygen Delivery Method: Circle system utilized Preoxygenation: Pre-oxygenation with 100% oxygen Induction Type: IV induction, Rapid sequence and Cricoid Pressure applied Ventilation: Mask ventilation without difficulty Laryngoscope Size: Mac and 4 Grade View: Grade II Tube type: Oral Tube size: 7.0 mm Number of attempts: 1 Airway Equipment and Method: Stylet Placement Confirmation: ETT inserted through vocal cords under direct vision,  positive ETCO2 and breath sounds checked- equal and bilateral Secured at: 22 cm Tube secured with: Tape Dental Injury: Teeth and Oropharynx as per pre-operative assessment

## 2018-03-29 NOTE — Anesthesia Postprocedure Evaluation (Signed)
Anesthesia Post Note  Patient: Selena Christian  Procedure(s) Performed: LAPAROSCOPIC CHOLECYSTECTOMY (N/A Abdomen)  Patient location during evaluation: PACU Anesthesia Type: General Level of consciousness: awake and alert and patient cooperative Pain management: satisfactory to patient Vital Signs Assessment: post-procedure vital signs reviewed and stable Respiratory status: spontaneous breathing Cardiovascular status: stable Postop Assessment: no apparent nausea or vomiting Anesthetic complications: no     Last Vitals:  Vitals:   03/29/18 1201 03/29/18 1227  BP: 116/75 110/71  Pulse: 82 74  Resp: 15   Temp:  36.6 C  SpO2: 94% 96%    Last Pain:  Vitals:   03/29/18 1227  TempSrc: Oral  PainSc: 7                  [name]

## 2018-03-29 NOTE — Progress Notes (Signed)
[facility]  Lap chole completed. Spoke with sister on the phone. If patient feels well, tolerates diet and has adequate pain control, can d/c home today. Follow up next week. 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   Curlene Labrum, MD [facility] [address] [place], [place] [address] [phone] (office)

## 2018-03-29 NOTE — Transfer of Care (Signed)
Immediate Anesthesia Transfer of Care Note  Patient: Selena Christian  Procedure(s) Performed: LAPAROSCOPIC CHOLECYSTECTOMY (N/A Abdomen)  Patient Location: PACU  Anesthesia Type:General  Level of Consciousness: awake and patient cooperative  Airway & Oxygen Therapy: Patient Spontanous Breathing and Patient connected to face mask oxygen  Post-op Assessment: Report given to RN, Post -op Vital signs reviewed and stable and Patient moving all extremities  Post vital signs: Reviewed and stable  Last Vitals:  Vitals Value Taken Time  BP    Temp    Pulse    Resp    SpO2      Last Pain:  Vitals:   03/29/18 0857  TempSrc:   PainSc: 3       Patients Stated Pain Goal: 3 (14/43/60 1658)  Complications: No apparent anesthesia complications

## 2018-03-29 NOTE — Discharge Instructions (Signed)
Discharge Laparoscopic Surgery Instructions:  Common Complaints: Right shoulder pain is common after laparoscopic surgery. This is secondary to the gas used in the surgery being trapped under the diaphragm.  Walk to help your body absorb the gas. This will improve in a few days. Pain at the port sites are common, especially the larger port sites. This will improve with time.  Some nausea is common and poor appetite. The main goal is to stay hydrated the first few days after surgery.   Diet/ Activity: Diet as tolerated. You may not have an appetite, but it is important to stay hydrated. Drink 64 ounces of water a day. Your appetite will return with time.  Shower per your regular routine daily.  Do not take hot showers. Take warm showers that are less than 10 minutes. Rest and listen to your body, but do not remain in bed all day.  Walk everyday for at least 15-20 minutes. Deep cough and move around every 1-2 hours in the first few days after surgery.  Do not lift > 10 lbs, perform excessive bending, pushing, pulling, squatting for 1-2 weeks after surgery.  Do not pick at the dermabond glue on your incision sites.  This glue film will remain in place for 1-2 weeks and will start to peel off.  Do not place lotions or balms on your incision unless instructed to specifically by Dr. Constance Haw.   Medication: Take tylenol and ibuprofen as needed for pain control, alternating every 4-6 hours.  Example:  Tylenol 1000mg  @ 6am, 12noon, 6pm, 32midnight (Do not exceed 4000mg  of tylenol a day). Ibuprofen 800mg  @ 9am, 3pm, 9pm, 3am (Do not exceed 3600mg  of ibuprofen a day).  Take Roxicodone for breakthrough pain every 4 hours.  Take Colace for constipation related to narcotic pain medication. If you do not have a bowel movement in 2 days, take Miralax over the counter.  Drink plenty of water to also prevent constipation.   Contact Information: If you have questions or concerns, please call our office,  [phone], Monday- Thursday 8AM-5PM and Friday 8AM-12Noon.  If it is after hours or on the weekend, please call [facility] Main Number, [phone], and ask to speak to the surgeon on call for Dr. Constance Haw at [facility].    Laparoscopic Cholecystectomy, Care After This sheet gives you information about how to care for yourself after your procedure. Your doctor may also give you more specific instructions. If you have problems or questions, contact your doctor. Follow these instructions at home: Care for cuts from surgery (incisions)   Follow instructions from your doctor about how to take care of your cuts from surgery. Make sure you: ? Wash your hands with soap and water before you change your bandage (dressing). If you cannot use soap and water, use hand sanitizer. ? Change your bandage as told by your doctor. ? Leave stitches (sutures), skin glue, or skin tape (adhesive) strips in place. They may need to stay in place for 2 weeks or longer. If tape strips get loose and curl up, you may trim the loose edges. Do not remove tape strips completely unless your doctor says it is okay.  Do not take baths, swim, or use a hot tub until your doctor says it is okay. You can take showers.   Check your surgical cut area every day for signs of infection. Check for: ? More redness, swelling, or pain. ? More fluid or blood. ? Warmth. ? Pus or a bad smell. Activity  Do  not drive or use heavy machinery while taking prescription pain medicine.  Do not lift anything that is heavier than 10 lb (4.5 kg) until your doctor says it is okay.  Do not play contact sports until your doctor says it is okay.  Do not drive for 24 hours if you were given a medicine to help you relax (sedative).  Rest as needed. Do not return to work or school until your doctor says it is okay. General instructions  Take over-the-counter and prescription medicines only as told by your doctor.  To prevent or treat constipation  while you are taking prescription pain medicine, your doctor may recommend that you: ? Drink enough fluid to keep your pee (urine) clear or pale yellow. ? Take over-the-counter or prescription medicines. ? Eat foods that are high in fiber, such as fresh fruits and vegetables, whole grains, and beans. ? Limit foods that are high in fat and processed sugars, such as fried and sweet foods. Contact a doctor if:  You develop a rash.  You have more redness, swelling, or pain around your surgical cuts.  You have more fluid or blood coming from your surgical cuts.  Your surgical cuts feel warm to the touch.  You have pus or a bad smell coming from your surgical cuts.  You have a fever.  One or more of your surgical cuts breaks open. Get help right away if:  You have trouble breathing.  You have chest pain.  You have pain that is getting worse in your shoulders.  You faint or feel dizzy when you stand.  You have very bad pain in your belly (abdomen).  You are sick to your stomach (nauseous) for more than one day.  You have throwing up (vomiting) that lasts for more than one day.  You have leg pain. This information is not intended to replace advice given to you by your health care provider. Make sure you discuss any questions you have with your health care provider. Document Released: 01/13/2008 Document Revised: 10/25/2015 Document Reviewed: 09/22/2015 Elsevier Interactive Patient Education  2018 [employer].

## 2018-03-29 NOTE — Op Note (Signed)
Operative Note   Preoperative Diagnosis: Symptomatic cholelithiasis   Postoperative Diagnosis: Acute Cholecystitis    Procedure(s) Performed: Laparoscopic cholecystectomy   Surgeon: Ria Comment C. Constance Haw, MD   Assistants: No qualified resident was available   Anesthesia: General endotracheal   Anesthesiologist: Lenice Llamas, MD    Specimens: Gallbladder    Estimated Blood Loss: Minimal    Blood Replacement: None    Complications: None    Operative Findings:  Distended gallbladder   Procedure: The patient was taken to the operating room and placed supine. General endotracheal anesthesia was induced. Intravenous antibiotics were administered per protocol. An orogastric tube positioned to decompress the stomach. The abdomen was prepared and draped in the usual sterile fashion.    A supraumbilical incision was made and a Veress technique was utilized to achieve pneumoperitoneum to 15 mmHg with carbon dioxide. A 11 mm optiview port was placed through the supraumbilical region, and a 10 mm 0-degree operative laparoscope was introduced. The area underlying the trocar and Veress needle were inspected and without evidence of injury.  Remaining trocars were placed under direct vision. Two 5 mm ports were placed in the right abdomen, between the anterior axillary and midclavicular line.  A final 11 mm port was placed through the mid-epigastrium, near the falciform ligament.    The gallbladder fundus was elevated cephalad and the infundibulum was retracted to the patient's right. The gallbladder/cystic duct junction was skeletonized. The cystic artery noted in the triangle of Calot and was also skeletonized.  We then continued liberal medial and lateral dissection until the critical view of safety was achieved.    The cystic duct and cystic artery were doubly clipped and divided. The gallbladder was then dissected from the liver bed with electrocautery.  A clip was placed at the hepatic bed  where a small posterior vessel was seen. The specimen was placed in an Endopouch and was retrieved through the epigastric site.   Final inspection revealed acceptable hemostasis. Surgical Snow and Loletta Parish was placed in the gallbladder bed. Trocars were removed and pneumoperitoneum was released.  0 Vicryl fascial sutures were used to close the epigastric port site, and the umbilical port site was too small to close. Skin incisions were closed with 4-0 Monocryl subcuticular sutures and Dermabond. The patient was awakened from anesthesia and extubated without complication.    Curlene Labrum, MD [facility] [address] [place], [place] [address] [phone] (office)

## 2018-03-29 NOTE — Interval H&P Note (Signed)
History and Physical Interval Note:  03/29/2018 9:41 AM  Selena Christian  has presented today for surgery, with the diagnosis of choleliathiasis  The various methods of treatment have been discussed with the patient and family. After consideration of risks, benefits and other options for treatment, the patient has consented to  Procedure(s): LAPAROSCOPIC CHOLECYSTECTOMY possible open (N/A) as a surgical intervention .  The patient's history has been reviewed, patient examined, no change in status, stable for surgery.  I have reviewed the patient's chart and labs.  Questions were answered to the patient's satisfaction.    No changes or questions.  Virl Cagey

## 2018-03-29 NOTE — Anesthesia Preprocedure Evaluation (Signed)
Anesthesia Evaluation  Patient identified by MRN, date of birth, ID band Patient awake    Reviewed: Allergy & Precautions, NPO status , Patient's Chart, lab work & pertinent test results  Airway Mallampati: I  TM Distance: >3 FB Neck ROM: Full    Dental no notable dental hx. (+) Teeth Intact   Pulmonary neg pulmonary ROS, former smoker,    Pulmonary exam normal breath sounds clear to auscultation       Cardiovascular Exercise Tolerance: Good hypertension, Pt. on medications negative cardio ROS Normal cardiovascular examI Rhythm:Regular Rate:Normal     Neuro/Psych negative neurological ROS  negative psych ROS   GI/Hepatic negative GI ROS, Neg liver ROS, occ GERD -none today -no current GERD meds   Endo/Other  negative endocrine ROS  Renal/GU negative Renal ROS  negative genitourinary   Musculoskeletal negative musculoskeletal ROS (+)   Abdominal   Peds negative pediatric ROS (+)  Hematology negative hematology ROS (+)   Anesthesia Other Findings   Reproductive/Obstetrics negative OB ROS                             Anesthesia Physical Anesthesia Plan  ASA: II  Anesthesia Plan: General   Post-op Pain Management:    Induction: Intravenous  PONV Risk Score and Plan:   Airway Management Planned: Oral ETT  Additional Equipment:   Intra-op Plan:   Post-operative Plan: Extubation in OR  Informed Consent: I have reviewed the patients History and Physical, chart, labs and discussed the procedure including the risks, benefits and alternatives for the proposed anesthesia with the patient or authorized representative who has indicated his/her understanding and acceptance.   Dental advisory given  Plan Discussed with: CRNA  Anesthesia Plan Comments:         Anesthesia Quick Evaluation

## 2018-03-29 NOTE — Care Management Obs Status (Signed)
[place] NOTIFICATION   Patient Details  Name: Selena Christian MRN: [id number] Date of Birth: 07/08/1950   Medicare Observation Status Notification Given:  Yes    Kylene Zamarron, Chauncey Reading, RN 03/29/2018, 7:32 AM

## 2018-03-30 NOTE — Discharge Summary (Signed)
Physician Discharge Summary  Patient ID: Selena Christian MRN: [id number] DOB/AGE: December 27, 1950 67 y.o.  Admit date: 03/28/2018 Discharge date: 03/30/2018  Admission Diagnoses: Chololithiasis    Discharge Diagnoses:  Principal Problem:   Gallstones Active Problems:   Intractable nausea and vomiting   Abdominal pain   Calculus of gallbladder with acute cholecystitis without obstruction   Discharged Condition: Good   Hospital Course: Selena Christian was admitted from the ED on 12/10 due RUQ pain that was not improving. She had [place] with stones but no signs of cholecystitis or biliary duct obstruction. She was taken to the OR 12/11 and underwent a laparoscopic cholecystectomy. The gallbladder was more consistent with acute cholecystitis during the operation. Post operatively she was given the option of going home that day versus staying overnight for pain control and nausea control. She opted to stay the night and by discharge was tolerating some fluids and had adequate pain control. Her Rx were sent to her pharmacy.   Consults:  None   Significant Diagnostic Studies: [place] - RUQ with stones  Treatments: Laparoscopic cholecystectomy   Discharge Exam: Blood pressure 107/73, pulse 94, temperature (!) 100.8 F (38.2 C), temperature source Oral, resp. rate 16, height 5\' 2"  (1.575 m), weight 87.5 kg, SpO2 93 %.   Disposition: Discharge disposition: 01-Home or Self Care       Discharge Instructions    Call MD for:  difficulty breathing, headache or visual disturbances   Complete by:  As directed    Call MD for:  extreme fatigue   Complete by:  As directed    Call MD for:  extreme fatigue   Complete by:  As directed    Call MD for:  persistant dizziness or light-headedness   Complete by:  As directed    Call MD for:  persistant dizziness or light-headedness   Complete by:  As directed    Call MD for:  persistant nausea and vomiting   Complete by:  As directed    Call MD for:  persistant  nausea and vomiting   Complete by:  As directed    Call MD for:  redness, tenderness, or signs of infection (pain, swelling, redness, odor or green/yellow discharge around incision site)   Complete by:  As directed    Call MD for:  redness, tenderness, or signs of infection (pain, swelling, redness, odor or green/yellow discharge around incision site)   Complete by:  As directed    Call MD for:  severe uncontrolled pain   Complete by:  As directed    Call MD for:  severe uncontrolled pain   Complete by:  As directed    Call MD for:  temperature >100.4   Complete by:  As directed    Call MD for:  temperature >100.4   Complete by:  As directed    Diet - low sodium heart healthy   Complete by:  As directed    Diet - low sodium heart healthy   Complete by:  As directed    Increase activity slowly   Complete by:  As directed    Increase activity slowly   Complete by:  As directed      Allergies as of 03/30/2018      Reactions   Penicillins Other (See Comments)   Child hood allergy      Medication List    TAKE these medications   ALKA-SELTZER ANTACID PO Take 1 tablet by mouth daily as needed.   amLODipine 5  MG tablet Commonly known as:  NORVASC Take 5 mg by mouth daily.   atorvastatin 40 MG tablet Commonly known as:  LIPITOR Take 40 mg by mouth daily.   docusate sodium 100 MG capsule Commonly known as:  COLACE Take 1 capsule (100 mg total) by mouth 2 (two) times daily.   hydrochlorothiazide 25 MG tablet Commonly known as:  HYDRODIURIL Take 25 mg by mouth daily.   naproxen 375 MG tablet Commonly known as:  NAPROSYN Take 375 mg by mouth 2 (two) times daily with a meal.   ondansetron 4 MG disintegrating tablet Commonly known as:  ZOFRAN-ODT Take 1 tablet (4 mg total) by mouth every 6 (six) hours as needed for nausea.   oxyCODONE 5 MG immediate release tablet Commonly known as:  Oxy IR/ROXICODONE Take 1 tablet (5 mg total) by mouth every 4 (four) hours as needed  for severe pain or breakthrough pain.      Follow-up Information    Virl Cagey, MD On 04/04/2018.   Specialty:  General Surgery Contact information: [address] [place] [address] [phone]           Signed: Virl Cagey 03/30/2018, 10:04 AM

## 2018-03-30 NOTE — Plan of Care (Signed)

## 2018-03-30 NOTE — Plan of Care (Signed)
  Problem: Education: Goal: Knowledge of General Education information will improve Description Including pain rating scale, medication(s)/side effects and non-pharmacologic comfort measures 03/30/2018 1006 by Rance Muir, RN Outcome: Adequate for Discharge 03/30/2018 0922 by Rance Muir, RN Outcome: Progressing   Problem: Health Behavior/Discharge Planning: Goal: Ability to manage health-related needs will improve 03/30/2018 1006 by Rance Muir, RN Outcome: Adequate for Discharge 03/30/2018 0922 by Rance Muir, RN Outcome: Progressing   Problem: Clinical Measurements: Goal: Ability to maintain clinical measurements within normal limits will improve 03/30/2018 1006 by Rance Muir, RN Outcome: Adequate for Discharge 03/30/2018 0922 by Rance Muir, RN Outcome: Progressing Goal: Will remain free from infection 03/30/2018 1006 by Rance Muir, RN Outcome: Adequate for Discharge 03/30/2018 0922 by Rance Muir, RN Outcome: Progressing Goal: Diagnostic test results will improve 03/30/2018 1006 by Rance Muir, RN Outcome: Adequate for Discharge 03/30/2018 0922 by Rance Muir, RN Outcome: Progressing Goal: Respiratory complications will improve 03/30/2018 1006 by Rance Muir, RN Outcome: Adequate for Discharge 03/30/2018 0922 by Rance Muir, RN Outcome: Progressing Goal: Cardiovascular complication will be avoided 03/30/2018 1006 by Rance Muir, RN Outcome: Adequate for Discharge 03/30/2018 0922 by Rance Muir, RN Outcome: Progressing   Problem: Activity: Goal: Risk for activity intolerance will decrease 03/30/2018 1006 by Rance Muir, RN Outcome: Adequate for Discharge 03/30/2018 0922 by Rance Muir, RN Outcome: Progressing   Problem: Nutrition: Goal: Adequate nutrition will be maintained 03/30/2018 1006 by Rance Muir, RN Outcome: Adequate for Discharge 03/30/2018 0922 by Rance Muir, RN Outcome: Progressing   Problem: Coping: Goal: Level of anxiety will decrease 03/30/2018 1006 by Rance Muir,  RN Outcome: Adequate for Discharge 03/30/2018 0922 by Rance Muir, RN Outcome: Progressing   Problem: Elimination: Goal: Will not experience complications related to bowel motility 03/30/2018 1006 by Rance Muir, RN Outcome: Adequate for Discharge 03/30/2018 0922 by Rance Muir, RN Outcome: Progressing Goal: Will not experience complications related to urinary retention 03/30/2018 1006 by Rance Muir, RN Outcome: Adequate for Discharge 03/30/2018 0922 by Rance Muir, RN Outcome: Progressing   Problem: Pain Managment: Goal: General experience of comfort will improve 03/30/2018 1006 by Rance Muir, RN Outcome: Adequate for Discharge 03/30/2018 0922 by Rance Muir, RN Outcome: Progressing   Problem: Safety: Goal: Ability to remain free from injury will improve 03/30/2018 1006 by Rance Muir, RN Outcome: Adequate for Discharge 03/30/2018 0922 by Rance Muir, RN Outcome: Progressing   Problem: Skin Integrity: Goal: Risk for impaired skin integrity will decrease 03/30/2018 1006 by Rance Muir, RN Outcome: Adequate for Discharge 03/30/2018 0922 by Rance Muir, RN Outcome: Progressing

## 2018-03-30 NOTE — Addendum Note (Signed)
Addendum  created 03/30/18 1105 by Vista Deck, CRNA   Intraprocedure Flowsheets edited

## 2018-03-31 ENCOUNTER — Encounter (HOSPITAL_COMMUNITY): Payer: Self-pay | Admitting: General Surgery

## 2018-04-04 ENCOUNTER — Ambulatory Visit (INDEPENDENT_AMBULATORY_CARE_PROVIDER_SITE_OTHER): Payer: Self-pay | Admitting: General Surgery

## 2018-04-04 ENCOUNTER — Encounter: Payer: Self-pay | Admitting: General Surgery

## 2018-04-04 VITALS — BP 140/86 | HR 94 | Temp 96.6°F | Resp 16 | Wt 178.8 lb

## 2018-04-04 DIAGNOSIS — K8020 Calculus of gallbladder without cholecystitis without obstruction: Secondary | ICD-10-CM

## 2018-04-04 NOTE — Progress Notes (Signed)
[facility] Note   HPI:  67 y.o. Female presents to clinic for post-op follow-up evaluation after a laparoscopic cholecystectomy. Patient reports she is feeling much better. She is still using some pain medication. Her appetite is not back to normal but is staying hydrated.  Review of Systems:  No fever or chills Improving pain Normal BMs  All other review of systems: otherwise negative   Pathology: Diagnosis Gallbladder - ACUTE AND CHRONIC CHOLECYSTITIS WITH CHOLELITHIASIS. - INCIDENTAL BENIGN LIVER.  Vital Signs:  BP 140/86 (BP Location: Left Arm, Patient Position: Sitting, Cuff Size: Normal)   Pulse 94   Temp (!) 96.6 F (35.9 C) (Temporal)   Resp 16   Wt 178 lb 12.8 oz (81.1 kg)   BMI 32.70 kg/m    Physical Exam:  Physical Exam Vitals signs reviewed.  Cardiovascular:     Rate and Rhythm: Normal rate.  Pulmonary:     Effort: Pulmonary effort is normal.  Abdominal:     General: Abdomen is flat. There is no distension.     Palpations: Abdomen is soft.     Tenderness: There is no abdominal tenderness.     Comments: Port site healing, dermabond peeling, no extending erythema, some minor erythema at the glue   Neurological:     Mental Status: She is alert.     Assessment:  67 y.o. yo Female s/p laparoscopic cholecystectomy. Doing well but still requiring pain meds. 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   Plan:  - Remain out of work for now, is on vacation after 12/21 until 1/6. Will see on 1/2 to get her cleared for return to work.     All of the above recommendations were discussed with the patient, and all of patient's questions were answered to her expressed satisfaction.  Curlene Labrum, MD [facility] [address] [place], [place] [address] [phone] (office)

## 2018-04-04 NOTE — Patient Instructions (Signed)
Diet as tolerated.  Activity as tolerated.

## 2018-04-20 ENCOUNTER — Encounter: Payer: Self-pay | Admitting: General Surgery

## 2018-04-20 ENCOUNTER — Ambulatory Visit (INDEPENDENT_AMBULATORY_CARE_PROVIDER_SITE_OTHER): Payer: Self-pay | Admitting: General Surgery

## 2018-04-20 VITALS — BP 148/90 | HR 85 | Temp 97.8°F | Wt 180.4 lb

## 2018-04-20 DIAGNOSIS — K8020 Calculus of gallbladder without cholecystitis without obstruction: Secondary | ICD-10-CM

## 2018-04-20 NOTE — Progress Notes (Signed)
[facility] Note   HPI:  68 y.o. Female presents to clinic for follow-up evaluation of after a laparoscopic cholecystectomy 12/11. Patient is no longer taking narcotic pain medication and is feeling better.  Review of Systems:  No fevers or chills Poor appetite but improving All other review of systems: otherwise negative    Vital Signs:  BP (!) 148/90 (BP Location: Left Arm, Patient Position: Sitting, Cuff Size: Normal)   Pulse 85   Temp 97.8 F (36.6 C) (Temporal)   Wt 180 lb 6.4 oz (81.8 kg)   SpO2 (!) 16%   BMI 33.00 kg/m    Physical Exam:  Physical Exam Vitals signs reviewed.  HENT:     Head: Normocephalic.  Cardiovascular:     Rate and Rhythm: Normal rate.  Pulmonary:     Effort: Pulmonary effort is normal.  Abdominal:     General: There is no distension.     Palpations: Abdomen is soft.     Tenderness: There is no abdominal tenderness.     Comments: Port sites healing  Neurological:     Mental Status: She is alert.     Assessment:  68 y.o. yo Female doing better. 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   Plan:  - Return to work Monday 04/24/2018  - PRN Follow up   All of the above recommendations were discussed with the patient and all of patient's questions were answered to her expressed satisfaction.  Curlene Labrum, MD [facility] [address] [place], [place] [address] [phone] (office)

## 2018-04-20 NOTE — Patient Instructions (Signed)
Activity and diet as tolerated.    

## 2018-05-11 ENCOUNTER — Encounter (INDEPENDENT_AMBULATORY_CARE_PROVIDER_SITE_OTHER): Payer: Self-pay

## 2018-05-11 ENCOUNTER — Other Ambulatory Visit (HOSPITAL_COMMUNITY)
Admission: RE | Admit: 2018-05-11 | Discharge: 2018-05-11 | Disposition: A | Discharge status: Home / Self Care | Payer: Medicare Other | Source: Ambulatory Visit | Attending: Adult Health | Admitting: Adult Health

## 2018-05-11 ENCOUNTER — Ambulatory Visit: Payer: Medicare Other | Admitting: Adult Health

## 2018-05-11 ENCOUNTER — Encounter: Payer: Self-pay | Admitting: Adult Health

## 2018-05-11 VITALS — BP 137/86 | HR 85 | Ht 61.0 in | Wt 181.4 lb

## 2018-05-11 DIAGNOSIS — R7309 Other abnormal glucose: Secondary | ICD-10-CM | POA: Diagnosis not present

## 2018-05-11 DIAGNOSIS — Z1211 Encounter for screening for malignant neoplasm of colon: Secondary | ICD-10-CM | POA: Diagnosis not present

## 2018-05-11 DIAGNOSIS — R9389 Abnormal findings on diagnostic imaging of other specified body structures: Secondary | ICD-10-CM

## 2018-05-11 DIAGNOSIS — Z1212 Encounter for screening for malignant neoplasm of rectum: Secondary | ICD-10-CM

## 2018-05-11 DIAGNOSIS — Z01419 Encounter for gynecological examination (general) (routine) without abnormal findings: Secondary | ICD-10-CM | POA: Insufficient documentation

## 2018-05-11 DIAGNOSIS — I10 Essential (primary) hypertension: Secondary | ICD-10-CM | POA: Diagnosis not present

## 2018-05-11 DIAGNOSIS — E782 Mixed hyperlipidemia: Secondary | ICD-10-CM | POA: Diagnosis not present

## 2018-05-11 DIAGNOSIS — E785 Hyperlipidemia, unspecified: Secondary | ICD-10-CM | POA: Diagnosis not present

## 2018-05-11 LAB — HEMOCCULT GUIAC POC 1CARD (OFFICE): Fecal Occult Blood, POC: NEGATIVE

## 2018-05-11 NOTE — Progress Notes (Signed)
Patient ID: Selena Christian, female   DOB: [birth year], 68 y.o.   MRN: [id number] History of Present Illness: Selena Christian is a 68 year old black female, divorced, PM in for well woman gyn exam and pap. 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 She had GB removed 03/28/18 by Dr Constance Haw. PCP is Dr Gerarda Fraction and Rowan Blase, PA.   Current Medications, Allergies, Past Medical History, Past Surgical History, Family History and Social History were reviewed in [employer] record.     Review of Systems: Patient denies any headaches, hearing loss, fatigue, blurred vision, shortness of breath, chest pain, abdominal pain, problems with bowel movements, urination, or intercourse(not active). No joint pain or mood swings. Denies any vaginal bleeding.When reviewed chart, she had CT and abdominal US 03/28/18 at [facility] that showed thickened endometrial lining 10.9 mm and fluid in endometrial canal.    Physical Exam:BP 137/86 (BP Location: Right Arm, Patient Position: Sitting, Cuff Size: Normal)   Pulse 85   Ht 5\' 1"  (1.549 m)   Wt 181 lb 6.4 oz (82.3 kg)   BMI 34.28 kg/m  General:  Well developed, well nourished, no acute distress Skin:  Warm and dry Neck:  Midline trachea, normal thyroid, good ROM, no lymphadenopathy,no carotid bruits heard Lungs; Clear to auscultation bilaterally Breast:  No dominant palpable mass, retraction, or nipple discharge Cardiovascular: Regular rate and rhythm Abdomen:  Soft, non tender, no hepatosplenomegaly Pelvic:  External genitalia is normal in appearance, no lesions.  The vagina is normal in appearance for age with loss of color, moisture and rugae. Urethra has no lesions or masses. The cervix is smooth, pap with HPV performed.  Uterus is felt to be normal size, shape, and contour.  No adnexal masses or tenderness noted.Bladder is non tender, no masses felt. Rectal: Good sphincter tone, no polyps, or hemorrhoids felt.  Hemoccult  negative. Extremities/musculoskeletal:  No swelling or varicosities noted, no clubbing or cyanosis Psych:  No mood changes, alert and cooperative,seems happy Fall risk is low. PHQ 2 score 0. Examination chaperoned by Diona Fanti, CMA.  Discussed [place] with her from 03/28/18 and also Dr Glo Herring, will get vaginal [place] to re check endometrial lining. In the light of no bleeding may not need endometrial biopsy but after [place] will discuss further.  Impression: 1. Encounter for gynecological examination with Papanicolaou smear of cervix   2. Screening for colorectal cancer   3. Thickened endometrium       Plan: Pap with HPV sent Pap in 3 years if normal Physical and labs with PCP Mammogram yearly Colonoscopy per GI  Return in 1 week for vaginal [place] to assess endometrial lining

## 2018-05-12 DIAGNOSIS — S46002D Unspecified injury of muscle(s) and tendon(s) of the rotator cuff of left shoulder, subsequent encounter: Secondary | ICD-10-CM | POA: Diagnosis not present

## 2018-05-16 LAB — CYTOLOGY - PAP
Diagnosis: NEGATIVE
HPV: NOT DETECTED

## 2018-05-17 ENCOUNTER — Ambulatory Visit (INDEPENDENT_AMBULATORY_CARE_PROVIDER_SITE_OTHER): Payer: Medicare Other

## 2018-05-17 DIAGNOSIS — R9389 Abnormal findings on diagnostic imaging of other specified body structures: Secondary | ICD-10-CM

## 2018-05-17 NOTE — Progress Notes (Signed)
PELVIC US TA/TV:homogeneous anteverted uterus,wnl,simple fluid filled endometrium,EEC 2.4 mm (wnl),normal ovaries bilat,ovaries appear mobile,no pain during ultrasound

## 2018-05-18 ENCOUNTER — Telehealth: Payer: Self-pay | Admitting: Adult Health

## 2018-05-18 NOTE — Telephone Encounter (Signed)
Left message that US showed 2.4 mm endometrium does not need endometrial biopsy. Uterus and ovaries look normal, (Dr Glo Herring looked at [place])

## 2018-09-07 ENCOUNTER — Other Ambulatory Visit (HOSPITAL_COMMUNITY): Payer: Self-pay | Admitting: Physician Assistant

## 2018-09-07 DIAGNOSIS — Z1231 Encounter for screening mammogram for malignant neoplasm of breast: Secondary | ICD-10-CM

## 2018-09-12 DIAGNOSIS — Z1389 Encounter for screening for other disorder: Secondary | ICD-10-CM | POA: Diagnosis not present

## 2018-09-12 DIAGNOSIS — Z0001 Encounter for general adult medical examination with abnormal findings: Secondary | ICD-10-CM | POA: Diagnosis not present

## 2018-09-12 DIAGNOSIS — M7552 Bursitis of left shoulder: Secondary | ICD-10-CM | POA: Diagnosis not present

## 2018-09-20 DIAGNOSIS — M25512 Pain in left shoulder: Secondary | ICD-10-CM | POA: Diagnosis not present

## 2018-09-20 DIAGNOSIS — M19012 Primary osteoarthritis, left shoulder: Secondary | ICD-10-CM | POA: Diagnosis not present

## 2019-02-13 ENCOUNTER — Other Ambulatory Visit: Payer: Self-pay | Admitting: *Deleted

## 2019-02-13 DIAGNOSIS — Z20822 Contact with and (suspected) exposure to covid-19: Secondary | ICD-10-CM

## 2019-02-13 DIAGNOSIS — J069 Acute upper respiratory infection, unspecified: Secondary | ICD-10-CM | POA: Diagnosis not present

## 2019-02-15 LAB — NOVEL CORONAVIRUS, NAA: SARS-CoV-2, NAA: NOT DETECTED

## 2019-02-16 ENCOUNTER — Telehealth: Payer: Self-pay | Admitting: General Practice

## 2019-02-16 DIAGNOSIS — Z23 Encounter for immunization: Secondary | ICD-10-CM | POA: Diagnosis not present

## 2019-02-16 NOTE — Telephone Encounter (Signed)
Pt called in for covid testing results  Advised of Not Detected result.

## 2019-03-19 DIAGNOSIS — I10 Essential (primary) hypertension: Secondary | ICD-10-CM | POA: Diagnosis not present

## 2019-03-19 DIAGNOSIS — E785 Hyperlipidemia, unspecified: Secondary | ICD-10-CM | POA: Diagnosis not present

## 2019-04-19 DIAGNOSIS — I10 Essential (primary) hypertension: Secondary | ICD-10-CM | POA: Diagnosis not present

## 2019-04-19 DIAGNOSIS — E785 Hyperlipidemia, unspecified: Secondary | ICD-10-CM | POA: Diagnosis not present

## 2019-07-18 DIAGNOSIS — I10 Essential (primary) hypertension: Secondary | ICD-10-CM | POA: Diagnosis not present

## 2019-07-18 DIAGNOSIS — E785 Hyperlipidemia, unspecified: Secondary | ICD-10-CM | POA: Diagnosis not present

## 2019-11-07 DIAGNOSIS — S86911A Strain of unspecified muscle(s) and tendon(s) at lower leg level, right leg, initial encounter: Secondary | ICD-10-CM | POA: Diagnosis not present

## 2019-11-07 DIAGNOSIS — M94261 Chondromalacia, right knee: Secondary | ICD-10-CM | POA: Diagnosis not present

## 2019-11-08 ENCOUNTER — Ambulatory Visit (HOSPITAL_COMMUNITY)
Admission: RE | Admit: 2019-11-08 | Discharge: 2019-11-08 | Disposition: A | Discharge status: Home / Self Care | Payer: Medicare Other | Source: Ambulatory Visit | Attending: Physician Assistant | Admitting: Physician Assistant

## 2019-11-08 ENCOUNTER — Other Ambulatory Visit: Payer: Self-pay

## 2019-11-08 ENCOUNTER — Other Ambulatory Visit (HOSPITAL_COMMUNITY): Payer: Self-pay | Admitting: Physician Assistant

## 2019-11-08 DIAGNOSIS — M25461 Effusion, right knee: Secondary | ICD-10-CM | POA: Diagnosis not present

## 2019-11-08 DIAGNOSIS — M94261 Chondromalacia, right knee: Secondary | ICD-10-CM

## 2019-11-08 DIAGNOSIS — M1711 Unilateral primary osteoarthritis, right knee: Secondary | ICD-10-CM | POA: Diagnosis not present

## 2019-12-18 DIAGNOSIS — I10 Essential (primary) hypertension: Secondary | ICD-10-CM | POA: Diagnosis not present

## 2019-12-18 DIAGNOSIS — E785 Hyperlipidemia, unspecified: Secondary | ICD-10-CM | POA: Diagnosis not present

## 2020-02-13 DIAGNOSIS — E782 Mixed hyperlipidemia: Secondary | ICD-10-CM | POA: Diagnosis not present

## 2020-02-13 DIAGNOSIS — Z0000 Encounter for general adult medical examination without abnormal findings: Secondary | ICD-10-CM | POA: Diagnosis not present

## 2020-02-13 DIAGNOSIS — R7309 Other abnormal glucose: Secondary | ICD-10-CM | POA: Diagnosis not present

## 2020-02-13 DIAGNOSIS — Z23 Encounter for immunization: Secondary | ICD-10-CM | POA: Diagnosis not present

## 2020-02-16 DIAGNOSIS — I10 Essential (primary) hypertension: Secondary | ICD-10-CM | POA: Diagnosis not present

## 2020-02-16 DIAGNOSIS — E7849 Other hyperlipidemia: Secondary | ICD-10-CM | POA: Diagnosis not present

## 2020-02-19 ENCOUNTER — Other Ambulatory Visit: Payer: Self-pay

## 2020-02-19 ENCOUNTER — Ambulatory Visit
Admission: EM | Admit: 2020-02-19 | Discharge: 2020-02-19 | Disposition: A | Discharge status: Home / Self Care | Payer: Medicare Other | Attending: Emergency Medicine | Admitting: Emergency Medicine

## 2020-02-19 DIAGNOSIS — Z1152 Encounter for screening for COVID-19: Secondary | ICD-10-CM | POA: Diagnosis not present

## 2020-02-20 LAB — NOVEL CORONAVIRUS, NAA: SARS-CoV-2, NAA: NOT DETECTED

## 2020-02-20 LAB — SARS-COV-2, NAA 2 DAY TAT

## 2020-03-18 DIAGNOSIS — E7849 Other hyperlipidemia: Secondary | ICD-10-CM | POA: Diagnosis not present

## 2020-03-18 DIAGNOSIS — I10 Essential (primary) hypertension: Secondary | ICD-10-CM | POA: Diagnosis not present

## 2020-04-18 DIAGNOSIS — I10 Essential (primary) hypertension: Secondary | ICD-10-CM | POA: Diagnosis not present

## 2020-04-18 DIAGNOSIS — E785 Hyperlipidemia, unspecified: Secondary | ICD-10-CM | POA: Diagnosis not present

## 2020-06-16 DIAGNOSIS — I10 Essential (primary) hypertension: Secondary | ICD-10-CM | POA: Diagnosis not present

## 2020-06-16 DIAGNOSIS — E785 Hyperlipidemia, unspecified: Secondary | ICD-10-CM | POA: Diagnosis not present

## 2020-08-16 DIAGNOSIS — E7849 Other hyperlipidemia: Secondary | ICD-10-CM | POA: Diagnosis not present

## 2020-08-16 DIAGNOSIS — E1165 Type 2 diabetes mellitus with hyperglycemia: Secondary | ICD-10-CM | POA: Diagnosis not present

## 2020-08-16 DIAGNOSIS — I10 Essential (primary) hypertension: Secondary | ICD-10-CM | POA: Diagnosis not present

## 2020-09-16 DIAGNOSIS — E785 Hyperlipidemia, unspecified: Secondary | ICD-10-CM | POA: Diagnosis not present

## 2020-09-16 DIAGNOSIS — I10 Essential (primary) hypertension: Secondary | ICD-10-CM | POA: Diagnosis not present

## 2020-10-07 ENCOUNTER — Other Ambulatory Visit (HOSPITAL_COMMUNITY): Payer: Self-pay | Admitting: Physician Assistant

## 2020-10-07 DIAGNOSIS — Z1231 Encounter for screening mammogram for malignant neoplasm of breast: Secondary | ICD-10-CM

## 2020-10-15 DIAGNOSIS — M25562 Pain in left knee: Secondary | ICD-10-CM | POA: Diagnosis not present

## 2020-10-15 DIAGNOSIS — M1991 Primary osteoarthritis, unspecified site: Secondary | ICD-10-CM | POA: Diagnosis not present

## 2020-10-15 DIAGNOSIS — M7122 Synovial cyst of popliteal space [Baker], left knee: Secondary | ICD-10-CM | POA: Diagnosis not present

## 2020-10-16 DIAGNOSIS — E785 Hyperlipidemia, unspecified: Secondary | ICD-10-CM | POA: Diagnosis not present

## 2020-10-16 DIAGNOSIS — I10 Essential (primary) hypertension: Secondary | ICD-10-CM | POA: Diagnosis not present

## 2020-10-29 ENCOUNTER — Other Ambulatory Visit: Payer: Self-pay

## 2020-10-29 ENCOUNTER — Ambulatory Visit (HOSPITAL_COMMUNITY)
Admission: RE | Admit: 2020-10-29 | Discharge: 2020-10-29 | Disposition: A | Discharge status: Home / Self Care | Payer: Medicare Other | Source: Ambulatory Visit | Attending: Physician Assistant | Admitting: Physician Assistant

## 2020-10-29 DIAGNOSIS — Z1231 Encounter for screening mammogram for malignant neoplasm of breast: Secondary | ICD-10-CM | POA: Insufficient documentation

## 2021-10-24 IMAGING — MG MM DIGITAL SCREENING BILAT W/ TOMO AND CAD
8 series · 8 of 24 positions shown · non-contrast
Comparison: Previous exam(s).

CLINICAL DATA: Screening.

EXAM:
DIGITAL SCREENING BILATERAL MAMMOGRAM WITH TOMOSYNTHESIS AND CAD
TECHNIQUE: Bilateral screening digital craniocaudal and mediolateral oblique
mammograms were obtained. Bilateral screening digital breast
tomosynthesis was performed. The images were evaluated with
computer-aided detection.

[R MLO synth-2D]
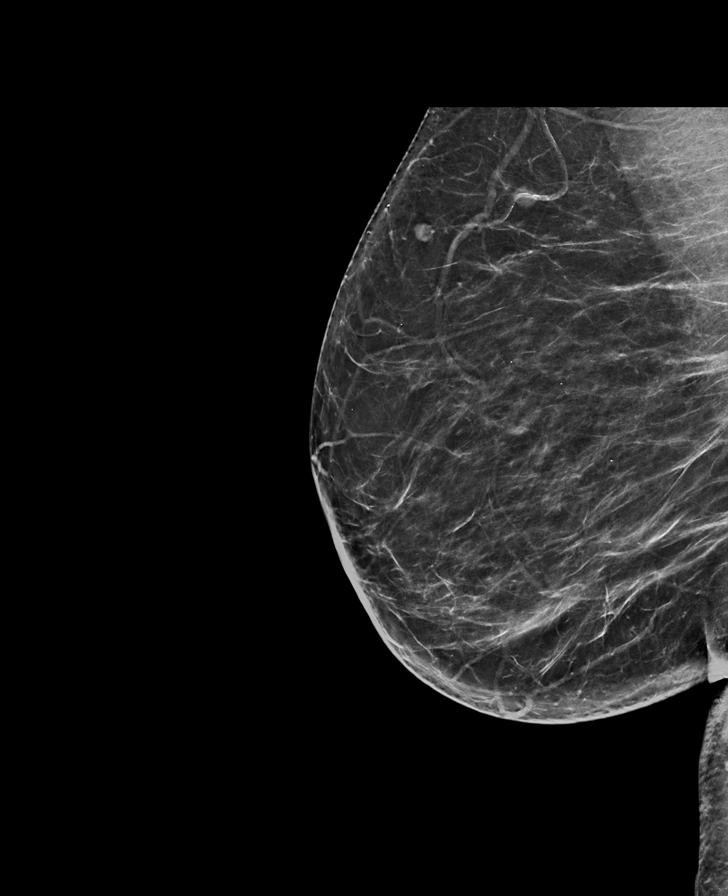

[R CC synth-2D]
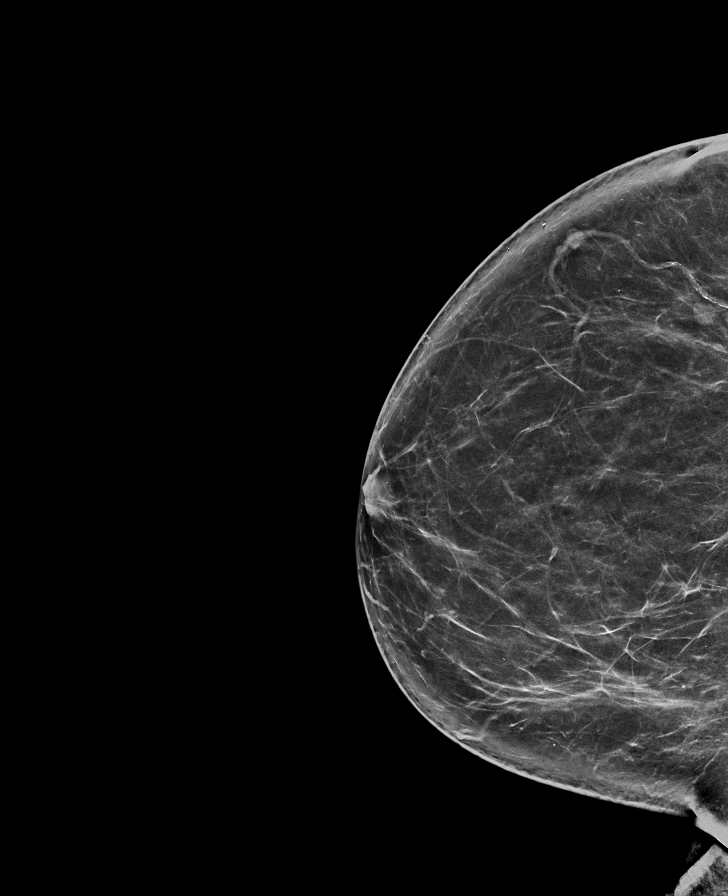

[L CC synth-2D]
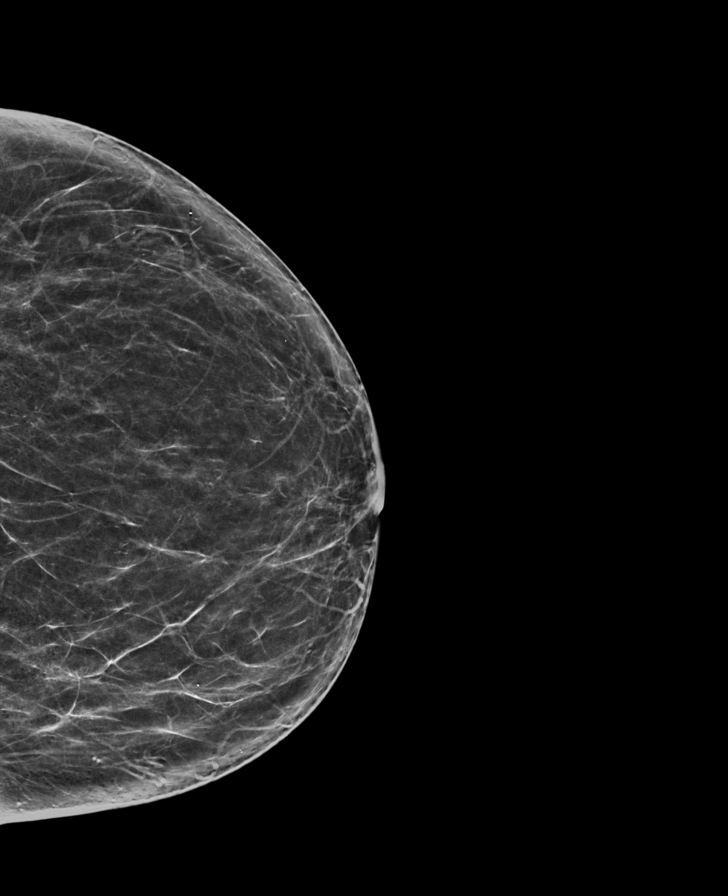

[L MLO synth-2D]
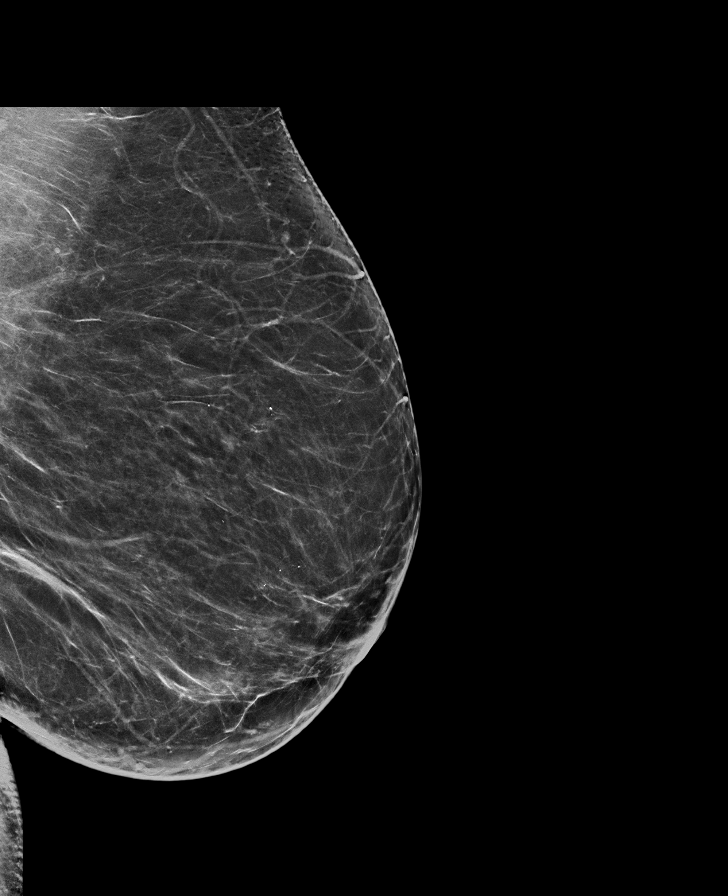

[L MLO tomo · tomo slice 39/77.0]
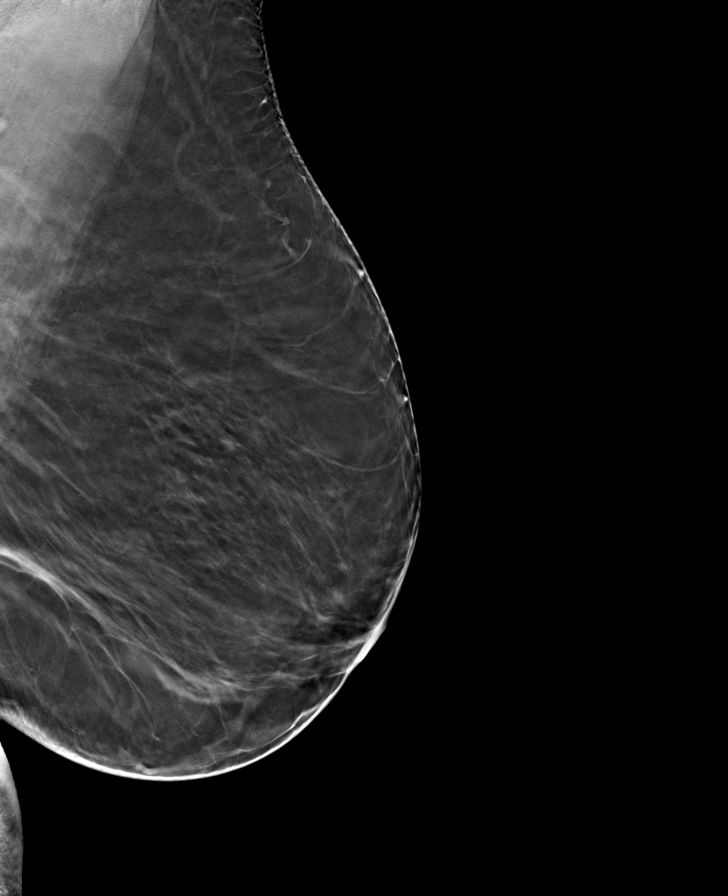

[L CC tomo · tomo slice 35/70.0]
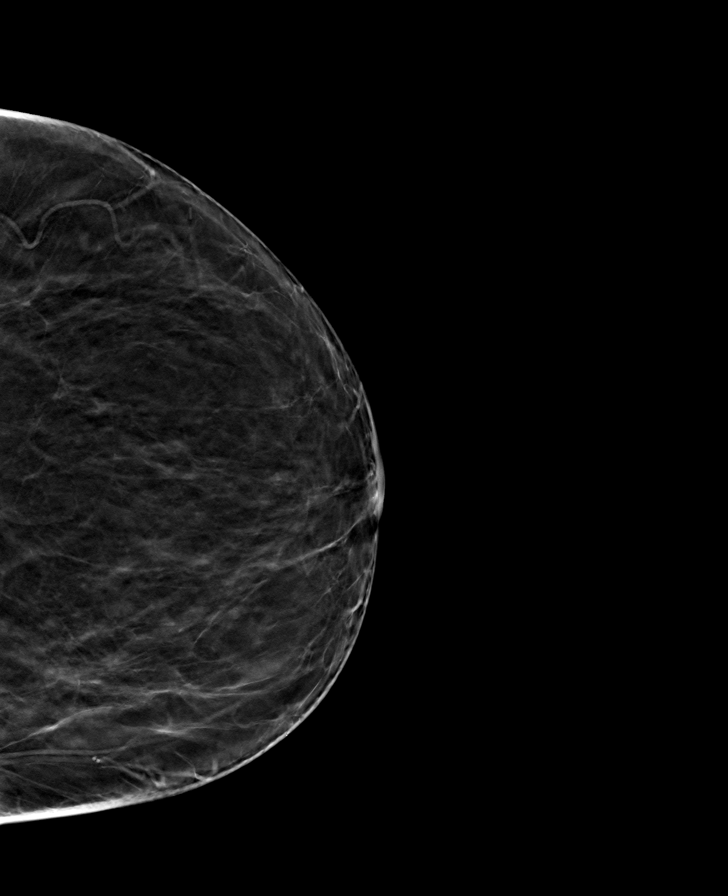

[R MLO tomo · tomo slice 38/75.0]
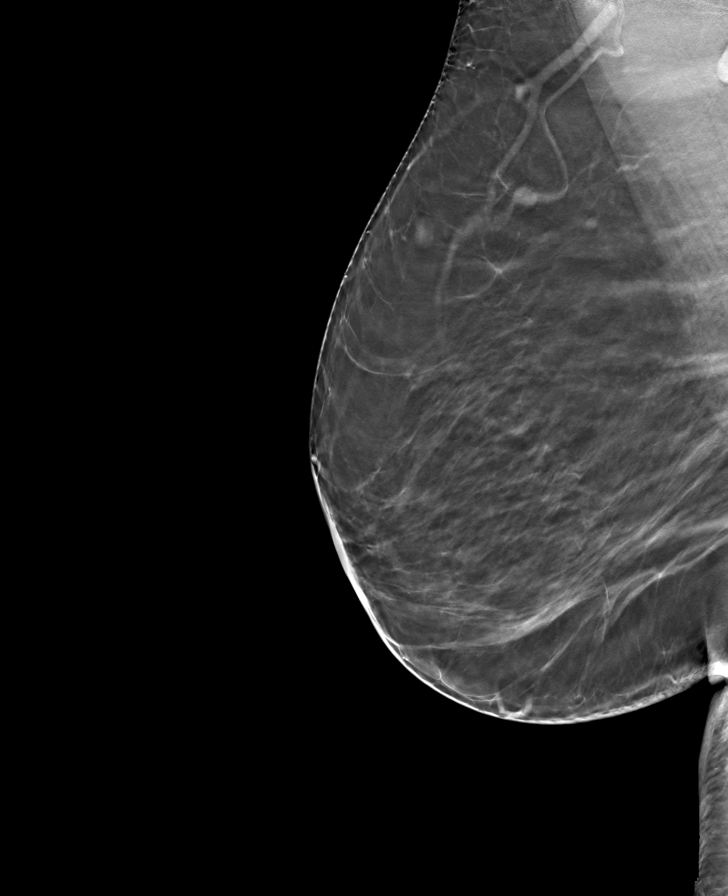

[R CC tomo · tomo slice 38/75.0]
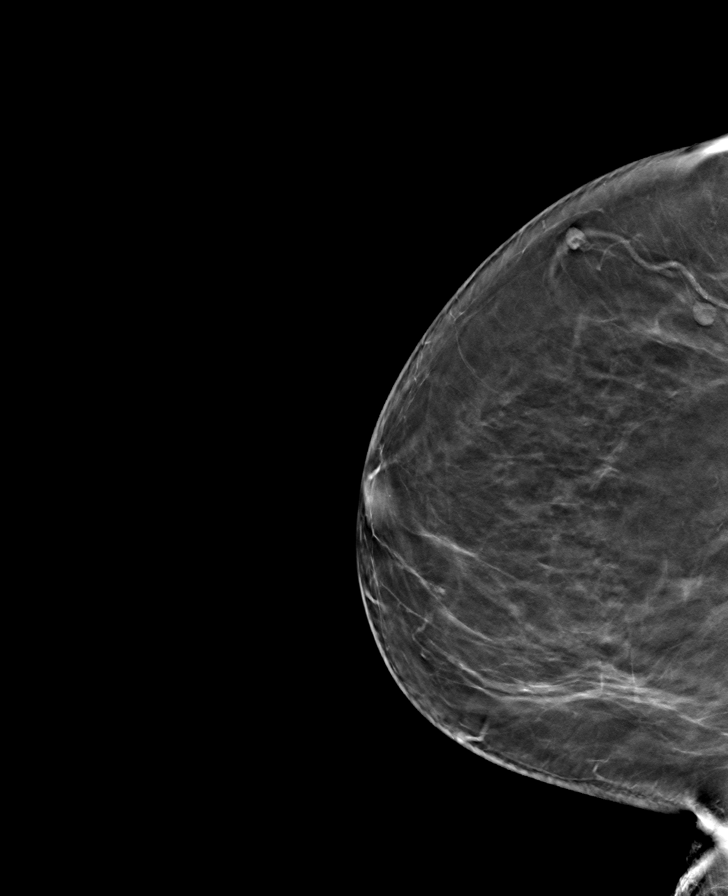

[8 of 24 positions shown; findings below may reference images not displayed]

ACR Breast Density Category b: There are scattered areas of
fibroglandular density.
FINDINGS: There are no findings suspicious for malignancy.
IMPRESSION: No mammographic evidence of malignancy. A result letter of this
screening mammogram will be mailed directly to the patient.

RECOMMENDATION:
Screening mammogram in one year. (Code:51-O-LD2)

BI-RADS CATEGORY  1: Negative.

## 2022-02-18 ENCOUNTER — Encounter: Payer: Self-pay | Admitting: *Deleted

## 2022-06-29 DIAGNOSIS — M1991 Primary osteoarthritis, unspecified site: Secondary | ICD-10-CM | POA: Diagnosis not present

## 2022-06-29 DIAGNOSIS — Z0001 Encounter for general adult medical examination with abnormal findings: Secondary | ICD-10-CM | POA: Diagnosis not present

## 2022-06-29 DIAGNOSIS — I10 Essential (primary) hypertension: Secondary | ICD-10-CM | POA: Diagnosis not present

## 2022-06-29 DIAGNOSIS — R7309 Other abnormal glucose: Secondary | ICD-10-CM | POA: Diagnosis not present

## 2022-06-29 DIAGNOSIS — E785 Hyperlipidemia, unspecified: Secondary | ICD-10-CM | POA: Diagnosis not present

## 2022-07-06 DIAGNOSIS — I10 Essential (primary) hypertension: Secondary | ICD-10-CM | POA: Diagnosis not present

## 2022-08-16 ENCOUNTER — Other Ambulatory Visit: Payer: Self-pay | Admitting: Family Medicine

## 2022-08-16 DIAGNOSIS — Z1231 Encounter for screening mammogram for malignant neoplasm of breast: Secondary | ICD-10-CM

## 2022-08-30 ENCOUNTER — Ambulatory Visit (HOSPITAL_COMMUNITY)
Admission: RE | Admit: 2022-08-30 | Discharge: 2022-08-30 | Disposition: A | Discharge status: Home / Self Care | Payer: Medicare Other | Source: Ambulatory Visit | Attending: Family Medicine | Admitting: Family Medicine

## 2022-08-30 ENCOUNTER — Encounter (HOSPITAL_COMMUNITY): Payer: Self-pay

## 2022-08-30 DIAGNOSIS — Z1231 Encounter for screening mammogram for malignant neoplasm of breast: Secondary | ICD-10-CM | POA: Insufficient documentation

## 2022-09-16 DIAGNOSIS — I10 Essential (primary) hypertension: Secondary | ICD-10-CM | POA: Diagnosis not present

## 2022-09-16 DIAGNOSIS — G4730 Sleep apnea, unspecified: Secondary | ICD-10-CM | POA: Diagnosis not present

## 2022-09-27 DIAGNOSIS — G4730 Sleep apnea, unspecified: Secondary | ICD-10-CM | POA: Diagnosis not present

## 2022-11-04 DIAGNOSIS — G4733 Obstructive sleep apnea (adult) (pediatric): Secondary | ICD-10-CM | POA: Diagnosis not present

## 2023-10-23 DIAGNOSIS — N390 Urinary tract infection, site not specified: Secondary | ICD-10-CM | POA: Diagnosis not present

## 2023-10-23 DIAGNOSIS — R6883 Chills (without fever): Secondary | ICD-10-CM | POA: Diagnosis not present

## 2023-10-23 DIAGNOSIS — R350 Frequency of micturition: Secondary | ICD-10-CM | POA: Diagnosis not present

## 2023-10-23 DIAGNOSIS — R110 Nausea: Secondary | ICD-10-CM | POA: Diagnosis not present

## 2024-09-25 ENCOUNTER — Ambulatory Visit: Payer: Self-pay
